# Patient Record
Sex: Male | Born: 1950 | Race: White | Hispanic: No | Marital: Married | Smoking: Light tobacco smoker
Health system: Southern US, Community
[De-identification: ages and names within clinical notes are randomized; demographics above are authoritative.]

## PROBLEM LIST (undated history)

## (undated) DIAGNOSIS — M1990 Unspecified osteoarthritis, unspecified site: Secondary | ICD-10-CM

## (undated) DIAGNOSIS — E785 Hyperlipidemia, unspecified: Secondary | ICD-10-CM

## (undated) DIAGNOSIS — N529 Male erectile dysfunction, unspecified: Secondary | ICD-10-CM

## (undated) HISTORY — DX: Male erectile dysfunction, unspecified: N52.9

## (undated) HISTORY — DX: Hyperlipidemia, unspecified: E78.5

## (undated) HISTORY — PX: DENTAL SURGERY: SHX609

## (undated) HISTORY — PX: VASECTOMY: SHX75

---

## 2003-02-11 HISTORY — PX: KNEE ARTHROSCOPY: SUR90

## 2005-09-15 ENCOUNTER — Ambulatory Visit: Payer: Self-pay | Admitting: Family Medicine

## 2005-10-08 ENCOUNTER — Ambulatory Visit: Payer: Self-pay | Admitting: Family Medicine

## 2005-10-10 ENCOUNTER — Ambulatory Visit: Payer: Self-pay | Admitting: Family Medicine

## 2005-10-14 ENCOUNTER — Ambulatory Visit: Payer: Self-pay | Admitting: Family Medicine

## 2005-10-17 ENCOUNTER — Ambulatory Visit: Payer: Self-pay | Admitting: Family Medicine

## 2005-10-22 ENCOUNTER — Ambulatory Visit: Payer: Self-pay | Admitting: Family Medicine

## 2005-12-10 ENCOUNTER — Ambulatory Visit: Payer: Self-pay | Admitting: Family Medicine

## 2006-02-24 DIAGNOSIS — F528 Other sexual dysfunction not due to a substance or known physiological condition: Secondary | ICD-10-CM | POA: Insufficient documentation

## 2006-09-18 ENCOUNTER — Ambulatory Visit: Payer: Self-pay | Admitting: Family Medicine

## 2006-09-18 ENCOUNTER — Telehealth (INDEPENDENT_AMBULATORY_CARE_PROVIDER_SITE_OTHER): Payer: Self-pay | Admitting: *Deleted

## 2008-07-25 ENCOUNTER — Ambulatory Visit: Payer: Self-pay | Admitting: Internal Medicine

## 2008-07-25 DIAGNOSIS — H0260 Xanthelasma of unspecified eye, unspecified eyelid: Secondary | ICD-10-CM | POA: Insufficient documentation

## 2008-08-16 ENCOUNTER — Ambulatory Visit: Payer: Self-pay | Admitting: Internal Medicine

## 2008-08-25 ENCOUNTER — Ambulatory Visit: Payer: Self-pay | Admitting: Internal Medicine

## 2008-08-26 LAB — CONVERTED CEMR LAB
ALT: 32 units/L (ref 0–53)
AST: 23 units/L (ref 0–37)
Albumin: 3.7 g/dL (ref 3.5–5.2)
Alkaline Phosphatase: 80 units/L (ref 39–117)
BUN: 23 mg/dL (ref 6–23)
Bilirubin, Direct: 0 mg/dL (ref 0.0–0.3)
Creatinine, Ser: 0.8 mg/dL (ref 0.4–1.5)
Hgb A1c MFr Bld: 6 % (ref 4.6–6.5)
Testosterone: 387.88 ng/dL (ref 350.00–890.00)
Total Protein: 7.3 g/dL (ref 6.0–8.3)

## 2008-08-28 ENCOUNTER — Encounter (INDEPENDENT_AMBULATORY_CARE_PROVIDER_SITE_OTHER): Payer: Self-pay | Admitting: *Deleted

## 2008-08-28 ENCOUNTER — Encounter: Payer: Self-pay | Admitting: Internal Medicine

## 2008-09-08 ENCOUNTER — Telehealth (INDEPENDENT_AMBULATORY_CARE_PROVIDER_SITE_OTHER): Payer: Self-pay | Admitting: *Deleted

## 2008-09-24 ENCOUNTER — Encounter: Payer: Self-pay | Admitting: Internal Medicine

## 2008-09-27 ENCOUNTER — Ambulatory Visit: Payer: Self-pay | Admitting: Internal Medicine

## 2008-09-27 DIAGNOSIS — E785 Hyperlipidemia, unspecified: Secondary | ICD-10-CM | POA: Insufficient documentation

## 2008-09-27 LAB — CONVERTED CEMR LAB: HDL goal, serum: 40 mg/dL

## 2008-12-20 ENCOUNTER — Ambulatory Visit: Payer: Self-pay | Admitting: Internal Medicine

## 2008-12-28 LAB — CONVERTED CEMR LAB
Bilirubin, Direct: 0.1 mg/dL (ref 0.0–0.3)
HDL: 27.7 mg/dL — ABNORMAL LOW (ref >39.00)
Hgb A1c MFr Bld: 6 % (ref 4.6–6.5)
LDL Cholesterol: 111 mg/dL — ABNORMAL HIGH (ref 0–99)
Total Bilirubin: 0.8 mg/dL (ref 0.3–1.2)
Total CHOL/HDL Ratio: 6
VLDL: 32.2 mg/dL (ref 0.0–40.0)

## 2008-12-29 ENCOUNTER — Encounter (INDEPENDENT_AMBULATORY_CARE_PROVIDER_SITE_OTHER): Payer: Self-pay | Admitting: *Deleted

## 2009-01-02 ENCOUNTER — Ambulatory Visit: Payer: Self-pay | Admitting: Internal Medicine

## 2009-06-12 ENCOUNTER — Ambulatory Visit: Payer: Self-pay | Admitting: Internal Medicine

## 2009-06-12 DIAGNOSIS — IMO0002 Reserved for concepts with insufficient information to code with codable children: Secondary | ICD-10-CM | POA: Insufficient documentation

## 2009-06-12 DIAGNOSIS — M6740 Ganglion, unspecified site: Secondary | ICD-10-CM | POA: Insufficient documentation

## 2009-06-12 DIAGNOSIS — M1710 Unilateral primary osteoarthritis, unspecified knee: Secondary | ICD-10-CM

## 2009-07-24 ENCOUNTER — Encounter (INDEPENDENT_AMBULATORY_CARE_PROVIDER_SITE_OTHER): Payer: Self-pay | Admitting: *Deleted

## 2009-07-26 ENCOUNTER — Ambulatory Visit: Payer: Self-pay | Admitting: Internal Medicine

## 2009-07-30 LAB — CONVERTED CEMR LAB
ALT: 33 units/L (ref 0–53)
Albumin: 3.9 g/dL (ref 3.5–5.2)
Bilirubin, Direct: 0.1 mg/dL (ref 0.0–0.3)
Cholesterol: 182 mg/dL (ref 0–200)
HDL: 36.1 mg/dL — ABNORMAL LOW (ref >39.00)
LDL Cholesterol: 116 mg/dL — ABNORMAL HIGH (ref 0–99)
Total Protein: 6.5 g/dL (ref 6.0–8.3)
Triglycerides: 148 mg/dL (ref 0.0–149.0)
VLDL: 29.6 mg/dL (ref 0.0–40.0)

## 2009-08-03 ENCOUNTER — Encounter: Payer: Self-pay | Admitting: Internal Medicine

## 2010-03-12 NOTE — Consult Note (Signed)
Summary: [facility]  [facility]   Imported By: Lanelle Bal 08/22/2009 09:12:52  _____________________________________________________________________  External Attachment:    Type:   Image     Comment:   External Document

## 2010-03-12 NOTE — Letter (Signed)
Summary: Primary Care Appointment Letter  [facility]  [address] [place], [place] [address]   Phone: [phone]  Fax: [phone]    07/24/2009 MRN: [id number]  James [name] [address] [place], [place]  [address]  Dear [name],   Your Primary Care Physician Marga Melnick MD has indicated that:    _______it is time to schedule an appointment.    _______you missed your appointment on______ and need to call and          reschedule.    ____X___you need to have lab work done, COPIED/PASTED FROM 12/2008 LABS: Recheck labs in 6 months (A1c, lipids; 277.7). Hopp.(Labs were due 06/2009).    _______you need to schedule an appointment discuss lab or test results.    _______you need to call to reschedule your appointment that is                       scheduled on _________.     Please call our office as soon as possible. Our phone number is [phone]. Please press option 1. Our office is open 8a-5p, Monday through Friday.     Thank you,    [facility]
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
## 2010-07-30 ENCOUNTER — Other Ambulatory Visit: Payer: Self-pay | Admitting: Internal Medicine

## 2010-07-30 NOTE — Telephone Encounter (Signed)
Last cholesterol check was 07/2009, patient can have labs 272.4/995.20 or CPX and have full lab panel

## 2010-08-17 ENCOUNTER — Other Ambulatory Visit: Payer: Self-pay | Admitting: Internal Medicine

## 2010-09-02 ENCOUNTER — Other Ambulatory Visit: Payer: Self-pay | Admitting: Internal Medicine

## 2010-09-02 NOTE — Telephone Encounter (Signed)
NMR/Hepatic 272.4/995.20

## 2010-10-05 ENCOUNTER — Other Ambulatory Visit: Payer: Self-pay | Admitting: Internal Medicine

## 2010-10-07 NOTE — Telephone Encounter (Signed)
NMR 272.4

## 2010-10-22 ENCOUNTER — Other Ambulatory Visit: Payer: Self-pay | Admitting: Internal Medicine

## 2010-10-22 DIAGNOSIS — E785 Hyperlipidemia, unspecified: Secondary | ICD-10-CM

## 2010-10-23 ENCOUNTER — Other Ambulatory Visit (INDEPENDENT_AMBULATORY_CARE_PROVIDER_SITE_OTHER): Payer: BC Managed Care – PPO

## 2010-10-23 DIAGNOSIS — E785 Hyperlipidemia, unspecified: Secondary | ICD-10-CM

## 2010-10-23 LAB — LIPID PANEL
HDL: 36.5 mg/dL — ABNORMAL LOW (ref >39.00)
LDL Cholesterol: 100 mg/dL — ABNORMAL HIGH (ref 0–99)
Total CHOL/HDL Ratio: 5
Triglycerides: 151 mg/dL — ABNORMAL HIGH (ref 0.0–149.0)

## 2010-10-23 LAB — BASIC METABOLIC PANEL
CO2: 24 mEq/L (ref 19–32)
Chloride: 108 mEq/L (ref 96–112)
Sodium: 140 mEq/L (ref 135–145)

## 2010-10-23 LAB — TSH: TSH: 2 u[IU]/mL (ref 0.35–5.50)

## 2010-10-23 LAB — HEPATIC FUNCTION PANEL: Albumin: 4 g/dL (ref 3.5–5.2)

## 2010-10-23 NOTE — Progress Notes (Signed)
Labs only

## 2010-10-25 NOTE — Progress Notes (Signed)
Labs only

## 2010-11-05 ENCOUNTER — Other Ambulatory Visit: Payer: Self-pay | Admitting: Internal Medicine

## 2010-11-26 ENCOUNTER — Emergency Department (HOSPITAL_BASED_OUTPATIENT_CLINIC_OR_DEPARTMENT_OTHER)
Admission: EM | Admit: 2010-11-26 | Discharge: 2010-11-26 | Disposition: A | Discharge status: Home / Self Care | Payer: BC Managed Care – PPO | Attending: Emergency Medicine | Admitting: Emergency Medicine

## 2010-11-26 ENCOUNTER — Encounter: Payer: Self-pay | Admitting: *Deleted

## 2010-11-26 DIAGNOSIS — S01312A Laceration without foreign body of left ear, initial encounter: Secondary | ICD-10-CM

## 2010-11-26 DIAGNOSIS — W1809XA Striking against other object with subsequent fall, initial encounter: Secondary | ICD-10-CM | POA: Insufficient documentation

## 2010-11-26 DIAGNOSIS — S01309A Unspecified open wound of unspecified ear, initial encounter: Secondary | ICD-10-CM | POA: Insufficient documentation

## 2010-11-26 DIAGNOSIS — Z8739 Personal history of other diseases of the musculoskeletal system and connective tissue: Secondary | ICD-10-CM | POA: Insufficient documentation

## 2010-11-26 HISTORY — DX: Unspecified osteoarthritis, unspecified site: M19.90

## 2010-11-26 MED ORDER — TETANUS-DIPHTH-ACELL PERTUSSIS 5-2.5-18.5 LF-MCG/0.5 IM SUSP
0.5000 mL | Freq: Once | INTRAMUSCULAR | Status: AC
  Administered 2010-11-26: 0.5 mL via INTRAMUSCULAR
  Filled 2010-11-26 (×2): qty 0.5

## 2010-11-26 MED ORDER — ONDANSETRON 8 MG PO TBDP
8.0000 mg | ORAL_TABLET | Freq: Once | ORAL | Status: AC
  Administered 2010-11-26: 8 mg via ORAL
  Filled 2010-11-26: qty 1

## 2010-11-26 MED ORDER — LIDOCAINE HCL 2 % IJ SOLN
INTRAMUSCULAR | Status: AC
  Administered 2010-11-26: 11:00:00
  Filled 2010-11-26: qty 1

## 2010-11-26 NOTE — ED Provider Notes (Signed)
History     CSN: [id number] Arrival date & time: 11/26/2010 10:28 AM  Chief Complaint  Patient presents with  . Ear Laceration    (Consider location/radiation/quality/duration/timing/severity/associated sxs/prior treatment) HPI Comments: Pt reports he missed a step and lost his balance, falling against the window of his car.  Reports laceration to left ear.  Denies LOC, dizziness or confusion after impact, difficulty with gait, vomiting, focal neurological deficits.  Pt is not on any blood thinners.   Patient is a 60 y.o. male presenting with skin laceration. The history is provided by the patient. No language interpreter was used.  Laceration  Pain location: left ear. Injury mechanism: trip and fall against car window.    Past Medical History  Diagnosis Date  . Arthritis     Past Surgical History  Procedure Date  . Dental surgery     full dental replacement  . Knee arthroscopy 2005  . Vasectomy     No family history on file.  History  Substance Use Topics  . Smoking status: Former Games developer  . Smokeless tobacco: Not on file  . Alcohol Use: No      Review of Systems  All other systems reviewed and are negative.    Allergies  Sulfur  Home Medications   Current Outpatient Rx  Name Route Sig Dispense Refill  . HYDROCODONE-ACETAMINOPHEN 5-325 MG PO TABS Oral Take 1 tablet by mouth every 6 (six) hours as needed.      Marland Kitchen CIALIS 20 MG PO TABS  TAKE 1 TABLET BY MOUTH EVERY 3 DAYS AS NEEDED 6 tablet 0    **APPOINTMENT DUE**  . CRESTOR 20 MG PO TABS  TAKE ONE TABLET BY MOUTH DAILY 30 tablet 0    **APPOINTMENT DUE**    BP 154/81  Pulse 96  Temp(Src) 98 F (36.7 C) (Oral)  Resp 20  Ht 5\' 8"  (1.727 m)  Wt 252 lb (114.306 kg)  BMI 38.32 kg/m2  SpO2 96%  Physical Exam  Constitutional: He is oriented to person, place, and time. He appears well-developed and well-nourished.  HENT:  Head: Normocephalic. Head is without raccoon's eyes and without Battle's sign.    Left Ear: Left ear exhibits lacerations.       Lacerations to left pinna, anterior and posterior.    Neck: Neck supple.  Pulmonary/Chest: Effort normal.  Neurological: He is alert and oriented to person, place, and time.    ED Course  LACERATION REPAIR Date/Time: 11/26/2010 11:35 AM Performed by: Trixie Dredge B Authorized by: Trixie Dredge B Consent: Verbal consent obtained. Written consent not obtained. Consent given by: patient Patient understanding: patient states understanding of the procedure being performed Patient identity confirmed: verbally with patient Body area: head/neck Location details: left ear Laceration length: 4 cm Foreign bodies: no foreign bodies Tendon involvement: none Anesthesia: nerve block and local infiltration Local anesthetic: lidocaine 2% without epinephrine Patient sedated: no Preparation: Patient was prepped and draped in the usual sterile fashion. Irrigation solution: saline Irrigation method: syringe Amount of cleaning: standard Debridement: none Degree of undermining: none Skin closure: 5-0 nylon Number of sutures: 11 Technique: simple Approximation: close Approximation difficulty: simple Dressing: gauze roll Patient tolerance: Patient tolerated the procedure well with no immediate complications. Comments: Tetanus vaccine given.    (including critical care time)  Labs Reviewed - No data to display No results found.   1. Laceration of left ear, external       MDM  Patient with trip and fall against window, no e/o  concussion or major head injury. Laceration of left ear.  Discussed close follow up and reasons for immediate return.  Pt verbalizes understanding.         Rise Patience, [place] 11/26/10 1235

## 2010-11-26 NOTE — ED Notes (Signed)
Patient states he missed a step in his gargage and fell against the car window.  Laceration to his left ear lobe.  Bleeding controlled. No loc.

## 2010-11-26 NOTE — ED Provider Notes (Signed)
History/physical exam/procedure(s) were performed by non-physician practitioner and as supervising physician I was immediately available for consultation/collaboration. I have reviewed all notes and am in agreement with care and plan.   Hilario Quarry, MD 11/26/10 [phone]

## 2010-11-28 ENCOUNTER — Emergency Department (HOSPITAL_BASED_OUTPATIENT_CLINIC_OR_DEPARTMENT_OTHER)
Admission: EM | Admit: 2010-11-28 | Discharge: 2010-11-28 | Disposition: A | Discharge status: Home / Self Care | Payer: BC Managed Care – PPO | Attending: Emergency Medicine | Admitting: Emergency Medicine

## 2010-11-28 ENCOUNTER — Encounter (HOSPITAL_BASED_OUTPATIENT_CLINIC_OR_DEPARTMENT_OTHER): Payer: Self-pay | Admitting: Family Medicine

## 2010-11-28 DIAGNOSIS — Z5189 Encounter for other specified aftercare: Secondary | ICD-10-CM

## 2010-11-28 DIAGNOSIS — Z8739 Personal history of other diseases of the musculoskeletal system and connective tissue: Secondary | ICD-10-CM | POA: Insufficient documentation

## 2010-11-28 DIAGNOSIS — Z09 Encounter for follow-up examination after completed treatment for conditions other than malignant neoplasm: Secondary | ICD-10-CM | POA: Insufficient documentation

## 2010-11-28 NOTE — ED Provider Notes (Signed)
History     CSN: [id number] Arrival date & time: 11/28/2010  4:01 PM   First MD Initiated Contact with Patient 11/28/10 1554      Chief Complaint  Patient presents with  . Follow-up    (Consider location/radiation/quality/duration/timing/severity/associated sxs/prior treatment) HPI Pt states he is here to have his ear checked.  He had a complex ear laceration two days ago and was told to get the wound checked.  The laceration is on the left ear.  He has had no pain or drainage.  The severity is mild.  No fevers or other complaints.  Pt feels that it is healing well. Past Medical History  Diagnosis Date  . Arthritis     Past Surgical History  Procedure Date  . Dental surgery     full dental replacement  . Knee arthroscopy 2005  . Vasectomy     No family history on file.  History  Substance Use Topics  . Smoking status: Former Games developer  . Smokeless tobacco: Not on file  . Alcohol Use: No      Review of Systems  All other systems reviewed and are negative.    Allergies  Sulfur  Home Medications   Current Outpatient Rx  Name Route Sig Dispense Refill  . CIALIS 20 MG PO TABS  TAKE 1 TABLET BY MOUTH EVERY 3 DAYS AS NEEDED 6 tablet 0    **APPOINTMENT DUE**  . CRESTOR 20 MG PO TABS  TAKE ONE TABLET BY MOUTH DAILY 30 tablet 0    **APPOINTMENT DUE**  . HYDROCODONE-ACETAMINOPHEN 5-325 MG PO TABS Oral Take 1 tablet by mouth every 6 (six) hours as needed.        BP 144/77  Pulse 67  Temp(Src) 98 F (36.7 C) (Oral)  Resp 16  SpO2 97%  Physical Exam  Nursing note and vitals reviewed. Constitutional: He appears well-developed and well-nourished. No distress.  HENT:  Head: Normocephalic and atraumatic.  Right Ear: External ear normal.       Left external ear, small amount of edema and ecchymoses, sutures without e/e, no drainage, no significant auricular hematoma  Eyes: Conjunctivae are normal. Right eye exhibits no discharge. Left eye exhibits no discharge. No  scleral icterus.  Neck: Neck supple. No tracheal deviation present.  Pulmonary/Chest: Effort normal. No stridor. No respiratory distress.  Musculoskeletal: He exhibits no edema.  Neurological: He is alert. Cranial nerve deficit: no gross deficits.  Skin: Skin is warm and dry. No rash noted.  Psychiatric: He has a normal mood and affect.    ED Course  Procedures (including critical care time)  Labs Reviewed - No data to display No results found.   No diagnosis found.    MDM  Wound appears to be healing well.  Ok for suture removal as scheduled        Celene Kras, MD 11/28/10 1610

## 2010-11-28 NOTE — ED Notes (Signed)
Pt here for recheck of sutures to left ear. No signs of infection.

## 2010-12-02 ENCOUNTER — Encounter: Payer: Self-pay | Admitting: Internal Medicine

## 2010-12-03 ENCOUNTER — Ambulatory Visit (INDEPENDENT_AMBULATORY_CARE_PROVIDER_SITE_OTHER): Payer: BC Managed Care – PPO | Admitting: Internal Medicine

## 2010-12-03 ENCOUNTER — Encounter: Payer: Self-pay | Admitting: Internal Medicine

## 2010-12-03 DIAGNOSIS — X58XXXA Exposure to other specified factors, initial encounter: Secondary | ICD-10-CM

## 2010-12-03 DIAGNOSIS — T148XXA Other injury of unspecified body region, initial encounter: Secondary | ICD-10-CM

## 2010-12-03 DIAGNOSIS — S86919A Strain of unspecified muscle(s) and tendon(s) at lower leg level, unspecified leg, initial encounter: Secondary | ICD-10-CM

## 2010-12-03 DIAGNOSIS — W1809XA Striking against other object with subsequent fall, initial encounter: Secondary | ICD-10-CM

## 2010-12-03 DIAGNOSIS — IMO0002 Reserved for concepts with insufficient information to code with codable children: Secondary | ICD-10-CM

## 2010-12-03 DIAGNOSIS — S86819A Strain of other muscle(s) and tendon(s) at lower leg level, unspecified leg, initial encounter: Secondary | ICD-10-CM

## 2010-12-03 DIAGNOSIS — W1800XA Striking against unspecified object with subsequent fall, initial encounter: Secondary | ICD-10-CM

## 2010-12-03 NOTE — Patient Instructions (Signed)
Use an anti-inflammatory cream such as Aspercreme or Zostrix cream twice a day to the left knee as needed. In lieu of this warm moist compresses or  hot water bottle can be used. Do not apply ice to the knees. .If symptoms persist or progress despite present treatment , Orthopedic F/U indicated. Eat a low-fat diet with lots of fruits and vegetables, up to 7-9 servings per day. Avoid obesity; your goal is waist measurement < 40 inches.Consume less than 40 grams of sugar per day from foods & drinks with High Fructose Corn Sugar as #1,2,3 or # 4 on label. Follow the low carb nutrition program in [employer] as closely as possible to prevent Diabetes progression & complications. White carbohydrates (potatoes, rice, bread, and pasta) have a high spike of sugar and a high load of sugar. For example a  baked potato has a cup of sugar and a  french fry  2 teaspoons of sugar. Yams, wild  rice, whole grained bread &  wheat pasta have been much lower spike and load of  sugar. Portions should be the size of a deck of cards or your palm.

## 2010-12-03 NOTE — Progress Notes (Signed)
  Subjective:    Patient ID: James [name], male    DOB: December 03, 1950, 60 y.o.   MRN: [id number]  HPI Fall  :   Onset: 10/16   .  Context: misstepped walking down brick steps & struck L ear on wife's car.  Eleven stitches  LOC/ Duration : not with accident or since  .   Cardiac Prodrome: heart racing/ heart irregularity/ palpitations : no  .   Neuro Prodrome:headache; numbness & tingling  ; weakness ;  Vertigo;gait dysfunction/falling ; tremor:no   ROS:  body injury:? Injury to L knee  Extremity pain: Pain quality:dull Pain severity:up to 5 Duration:with each step Radiation:no Treatment/response:ice, Celebrex, Tramadol, Voltaren gel . Celebrex has been more effective than the other interventions Review of systems: Constitutional: no fever, chills, sweats  Musculoskeletal:no  muscle cramps or pain; no  joint redness, or swelling. Localized stiffness medially Skin:no rash, color change Neuro: no weakness; numbness and tingling Heme:no  abnormal bruising or bleeding   Past medical history: His orthopedist has injected the right knee with steroids with good response                                                                                                                                                       Review of Systems     Objective:   Physical Exam he is in no acute distress.  The sutures were removed by the nurse without difficulty. There were 11. The laceration is well healed with no sign of cellulitis.  Cranial nerve exam is normal. He does have bilateral mild ptosis. Ears increased cerumen in the canals.  He has no lymphadenopathy about the neck  He has fusiform changes of both knees; minimal knee effusion is suggested, possibly actually worse on the right,. There is crepitus on the left but good range of motion.          Assessment & Plan:  #1 fall due  to misstep; no cardiac or neuro disposition  #2 laceration well healed with no sign of  complication  #3 strain the knee in the fall with minimal effusion  Plan: See orders and recommendations

## 2010-12-07 ENCOUNTER — Other Ambulatory Visit: Payer: Self-pay | Admitting: Internal Medicine

## 2011-07-21 ENCOUNTER — Other Ambulatory Visit: Payer: Self-pay | Admitting: Internal Medicine

## 2011-07-21 NOTE — Telephone Encounter (Signed)
Last Ov 12-03-10, last filled 7--7-12 #6

## 2011-07-21 NOTE — Telephone Encounter (Signed)
OK X1 

## 2011-10-27 ENCOUNTER — Other Ambulatory Visit: Payer: Self-pay | Admitting: Internal Medicine

## 2011-10-27 DIAGNOSIS — T887XXA Unspecified adverse effect of drug or medicament, initial encounter: Secondary | ICD-10-CM

## 2011-10-27 DIAGNOSIS — E785 Hyperlipidemia, unspecified: Secondary | ICD-10-CM

## 2011-10-27 MED ORDER — ROSUVASTATIN CALCIUM 20 MG PO TABS: ORAL_TABLET | ORAL | Status: DC

## 2011-10-27 NOTE — Telephone Encounter (Signed)
Refill crestor 20mg  tablets #30 last refill 9.4.13 no instructions listed Last ov 10.23.12 f/u to remove stitches last labs 10/2010

## 2011-10-27 NOTE — Telephone Encounter (Signed)
Patient needs to have labs (future orders placed), patient also needs to schedule a CPX

## 2011-12-14 ENCOUNTER — Other Ambulatory Visit: Payer: Self-pay | Admitting: Internal Medicine

## 2011-12-16 NOTE — Telephone Encounter (Signed)
Order placed

## 2012-01-12 ENCOUNTER — Other Ambulatory Visit: Payer: Self-pay | Admitting: Internal Medicine

## 2012-01-12 MED ORDER — ROSUVASTATIN CALCIUM 20 MG PO TABS: ORAL_TABLET | ORAL | Status: DC

## 2012-01-12 NOTE — Telephone Encounter (Signed)
refill Crestor 20MG  tablets #30 take one tablet b y mouth Daily #30 last fill 11.02.13-NOTE NO FUTURE Appts/LABS scheduled as per last refill

## 2012-01-12 NOTE — Telephone Encounter (Signed)
1/2 Supply given due to numerous reminders that appointment due, future orders placed since 10/2011

## 2012-02-13 ENCOUNTER — Other Ambulatory Visit: Payer: Self-pay | Admitting: Internal Medicine

## 2012-02-13 NOTE — Telephone Encounter (Signed)
Future orders placed 

## 2012-02-23 ENCOUNTER — Other Ambulatory Visit (INDEPENDENT_AMBULATORY_CARE_PROVIDER_SITE_OTHER): Payer: BC Managed Care – PPO

## 2012-02-23 DIAGNOSIS — T887XXA Unspecified adverse effect of drug or medicament, initial encounter: Secondary | ICD-10-CM

## 2012-02-23 DIAGNOSIS — E785 Hyperlipidemia, unspecified: Secondary | ICD-10-CM

## 2012-02-23 LAB — LIPID PANEL
Cholesterol: 158 mg/dL (ref 0–200)
HDL: 29.7 mg/dL — ABNORMAL LOW (ref >39.00)
Triglycerides: 126 mg/dL (ref 0.0–149.0)

## 2012-02-23 LAB — HEPATIC FUNCTION PANEL
ALT: 37 U/L (ref 0–53)
AST: 23 U/L (ref 0–37)
Albumin: 3.8 g/dL (ref 3.5–5.2)
Alkaline Phosphatase: 71 U/L (ref 39–117)
Total Protein: 7.4 g/dL (ref 6.0–8.3)

## 2012-02-24 ENCOUNTER — Encounter: Payer: Self-pay | Admitting: Internal Medicine

## 2012-02-28 ENCOUNTER — Other Ambulatory Visit: Payer: Self-pay | Admitting: Internal Medicine

## 2012-03-03 ENCOUNTER — Telehealth: Payer: Self-pay | Admitting: Internal Medicine

## 2012-03-03 NOTE — Telephone Encounter (Signed)
pt called in scheduled OV for Friday 1.24.13 at 3:30pm

## 2012-03-03 NOTE — Telephone Encounter (Signed)
Message copied by Verner Chol on Wed Mar 03, 2012 10:44 AM ------      Message from: Arvilla Meres P      Created: Thu Feb 26, 2012 11:50 AM      Regarding: FYI       Pt needs OV-lm w spouse for pt to call office and schedule appt.      ----- Message -----         From: Pecola Lawless, MD         Sent: 02/24/2012   6:03 PM           To: Marshell Garfinkel             Please make appointment to discuss long term risk & options.Last OV 10/12

## 2012-03-05 ENCOUNTER — Ambulatory Visit (INDEPENDENT_AMBULATORY_CARE_PROVIDER_SITE_OTHER): Payer: BC Managed Care – PPO | Admitting: Internal Medicine

## 2012-03-05 ENCOUNTER — Encounter: Payer: Self-pay | Admitting: Internal Medicine

## 2012-03-05 VITALS — BP 124/82 | HR 67 | Wt 268.0 lb

## 2012-03-05 DIAGNOSIS — E785 Hyperlipidemia, unspecified: Secondary | ICD-10-CM

## 2012-03-05 NOTE — Progress Notes (Signed)
  Subjective:    Patient ID: James [name], male    DOB: December 26, 1950, 62 y.o.   MRN: [id number]  HPI Dyslipidemia assessment: Prior Advanced Lipid Testing: NMR 2010; LDL goal < 100, ideally < 70.   Family history of premature CAD/ MI: no but stents in bro & mother .  Nutrition: increased salad & fish but eating out  .  Exercise: to start . Diabetes : no . HTN: no. Smoking history  :1-2  cigars/week    Lab results reviewed : in 2010 his LDL was 223; this was done closed over 3100 particles with 1640 small dense particles were 31% long-term risk. Triglycerides at that time were 210 suggesting suboptimal nutrition.  There's been a phenomenal improvement in his lipids. His triglycerides are now 126 and his LDL is 103 on 20 mg of Crestor daily.    Review of Systems Weight : stable. Emotional fatigue (wife has Parkinson's) ; no chest pain  ; no claudication; no palpitations; no abd pain/bowel changes ;no myalgias; no syncope  ;no  memory loss; no skin changes     Objective:   Physical Exam He appears well-nourished; he is in no acute distress  No carotid bruits are present.  Heart rhythm and rate are normal with no significant murmurs or gallops.S4 with slurring at  LSB   Chest is clear with no increased work of breathing  There is no evidence of aortic aneurysm or renal artery bruits  He has no clubbing or edema.   Pedal pulses are intact   No ischemic skin changes are present         Assessment & Plan:

## 2012-03-05 NOTE — Assessment & Plan Note (Signed)
No changes recommended medication; we discussed the importance of regular cardiovascular exercise and good nutrition.

## 2012-03-05 NOTE — Patient Instructions (Addendum)
The most common cause of elevated triglycerides is the ingestion of sugar from high fructose corn syrup sources added to processed foods & drinks.  Eat a low-fat diet with lots of fruits and vegetables, up to 7-9 servings per day. Consume less than 40 (preferably ZERO) grams of sugar per day from foods & drinks with High Fructose Corn Syrup (HFCS) sugar as #1,2,3 or # 4 on label.Whole Foods, [employer] & [employer] do not carry products with HFCS. Cardiovascular exercise, this can be as simple a program as walking, is recommended 30-45 minutes 3-4 times per week. If you're not exercising you should take 6-8 weeks to build up to this level.  Please take enteric-coated aspirin 81 mg daily with breakfast.   If you activate My Chart; the results can be released to you as soon as they populate from the lab. If you choose not to use this program; the labs have to be reviewed, copied & mailed   causing a delay in getting the results to you.

## 2012-04-02 ENCOUNTER — Other Ambulatory Visit: Payer: Self-pay | Admitting: Internal Medicine

## 2012-07-06 ENCOUNTER — Telehealth: Payer: Self-pay | Admitting: *Deleted

## 2012-07-06 DIAGNOSIS — E785 Hyperlipidemia, unspecified: Secondary | ICD-10-CM

## 2012-07-06 DIAGNOSIS — T887XXA Unspecified adverse effect of drug or medicament, initial encounter: Secondary | ICD-10-CM

## 2012-07-06 NOTE — Telephone Encounter (Signed)
Based on his NMR LipoProfile he had approximately 31% increased risk of heart attack or stroke based on particle number and LDL of 223. We can see if  atorvastatin 20 mg daily will get him to goal. If not then we can request the Crestor.  Hold Crestor and start atorvastatin 20 mg daily. Dispense 90.  Fasting lipids, hepatic panel, CK after 10 weeks of Lipitor. Codes 272.4, 995.20

## 2012-07-06 NOTE — Telephone Encounter (Signed)
Fax from pharmacy indicating that prior auth needed. Review Pt chart no documentation of Pt trying any other cholesterol med beside crestor. Please advise if you would like to proceed with PA

## 2012-07-07 MED ORDER — ATORVASTATIN CALCIUM 20 MG PO TABS: 20.0000 mg | ORAL_TABLET | Freq: Every day | ORAL | Status: DC

## 2012-07-07 NOTE — Telephone Encounter (Signed)
Discuss with patient, Rx sent. 

## 2012-07-09 ENCOUNTER — Other Ambulatory Visit: Payer: Self-pay | Admitting: Internal Medicine

## 2012-10-04 ENCOUNTER — Other Ambulatory Visit: Payer: Self-pay | Admitting: Internal Medicine

## 2012-10-13 ENCOUNTER — Ambulatory Visit (INDEPENDENT_AMBULATORY_CARE_PROVIDER_SITE_OTHER): Payer: BC Managed Care – PPO | Admitting: Family Medicine

## 2012-10-13 ENCOUNTER — Encounter: Payer: Self-pay | Admitting: Family Medicine

## 2012-10-13 VITALS — BP 128/80 | HR 67 | Temp 98.3°F | Ht 68.0 in | Wt 267.6 lb

## 2012-10-13 DIAGNOSIS — E785 Hyperlipidemia, unspecified: Secondary | ICD-10-CM

## 2012-10-13 DIAGNOSIS — L909 Atrophic disorder of skin, unspecified: Secondary | ICD-10-CM

## 2012-10-13 DIAGNOSIS — L918 Other hypertrophic disorders of the skin: Secondary | ICD-10-CM

## 2012-10-13 DIAGNOSIS — T887XXA Unspecified adverse effect of drug or medicament, initial encounter: Secondary | ICD-10-CM

## 2012-10-13 LAB — HEPATIC FUNCTION PANEL
Alkaline Phosphatase: 68 U/L (ref 39–117)
Bilirubin, Direct: 0 mg/dL (ref 0.0–0.3)
Total Bilirubin: 0.3 mg/dL (ref 0.3–1.2)

## 2012-10-13 LAB — LIPID PANEL
Total CHOL/HDL Ratio: 7
VLDL: 54.6 mg/dL — ABNORMAL HIGH (ref 0.0–40.0)

## 2012-10-13 NOTE — Patient Instructions (Addendum)
We'll call you with your dermatology appt Call with any questions or concerns Happy early birthday!!!

## 2012-10-13 NOTE — Assessment & Plan Note (Signed)
New.  Given close proximity to eye and the fact that it is now in pt's field of vision, will refer to derm for removal.  Pt expressed understanding and is in agreement w/ plan.

## 2012-10-13 NOTE — Progress Notes (Signed)
  Subjective:    Patient ID: James [name], male    DOB: 10-May-1950, 62 y.o.   MRN: [id number]  HPI Skin tags- large skin tag present at lash line of R lower lid.  Has smaller scattered tags on L eye and R upper and lower lids.  Large tag is now interfering w/ field of vision.  Tender when rubbed over.  Desires removal.   Review of Systems For ROS see HPI     Objective:   Physical Exam  Vitals reviewed. Constitutional: He appears well-developed and well-nourished. No distress.  HENT:  Head: Normocephalic and atraumatic.  Eyes: Conjunctivae and EOM are normal. Pupils are equal, round, and reactive to light.  Large skin tag under R eye along lower lid w/ multiple scattered skin tags along lids bilaterally          Assessment & Plan:

## 2012-10-14 ENCOUNTER — Encounter: Payer: Self-pay | Admitting: *Deleted

## 2012-12-27 ENCOUNTER — Other Ambulatory Visit: Payer: Self-pay | Admitting: Internal Medicine

## 2012-12-27 NOTE — Telephone Encounter (Signed)
cialis refilled per protocol

## 2013-01-01 ENCOUNTER — Other Ambulatory Visit: Payer: Self-pay | Admitting: Internal Medicine

## 2013-01-02 NOTE — Telephone Encounter (Signed)
Atorvastatin refilled per protocol 

## 2013-01-30 ENCOUNTER — Other Ambulatory Visit: Payer: Self-pay | Admitting: Internal Medicine

## 2013-02-01 NOTE — Telephone Encounter (Signed)
Atorvastatin refilled per protocol. OV due. JG//CMA

## 2013-02-28 ENCOUNTER — Other Ambulatory Visit: Payer: Self-pay | Admitting: Internal Medicine

## 2013-03-29 ENCOUNTER — Other Ambulatory Visit: Payer: Self-pay | Admitting: Internal Medicine

## 2013-03-30 NOTE — Telephone Encounter (Signed)
Rx sent to the pharmacy by e-script.  Pt needs complete physical.//AB/CMA 

## 2013-04-28 ENCOUNTER — Other Ambulatory Visit: Payer: Self-pay | Admitting: Internal Medicine

## 2013-05-19 ENCOUNTER — Telehealth: Payer: Self-pay | Admitting: *Deleted

## 2013-05-19 NOTE — Telephone Encounter (Signed)
Message copied by Verdie ShireBAYNES, ANGELA M on Thu May 19, 2013  2:43 PM ------      Message from: Pecola LawlessHOPPER, WILLIAM F      Created: Wed May 18, 2013  5:13 PM       Check formulary coverage for Cialis; alternatively cheaper therapeutically equivalent option for Cialis or Viagra is available from  [facility] at [phone]. ------

## 2013-05-19 NOTE — Telephone Encounter (Signed)
LMOM (2:43pm) asking the pt to RTC regarding Prior Authorization for the Cialis 20mg .//AB/CMA

## 2013-05-19 NOTE — Telephone Encounter (Signed)
Spoke with the pt and informed him of Dr. Frederik PearHopper's note below.  Pt understood and stated that he will call [facility].  Pt was given the number to call.//AB/CMA

## 2013-05-27 ENCOUNTER — Other Ambulatory Visit: Payer: Self-pay | Admitting: Internal Medicine

## 2013-05-27 ENCOUNTER — Telehealth: Payer: Self-pay | Admitting: Internal Medicine

## 2013-05-27 DIAGNOSIS — E785 Hyperlipidemia, unspecified: Secondary | ICD-10-CM

## 2013-05-27 DIAGNOSIS — R7309 Other abnormal glucose: Secondary | ICD-10-CM

## 2013-05-27 NOTE — Telephone Encounter (Signed)
I am sorry , but he needs F/U of lipids & OV  before I can do this. I saw him last 03/05/12. Orders entered

## 2013-05-27 NOTE — Telephone Encounter (Signed)
I called and spoke to patient. He states the [place] office is to far for him so [facility] and get things handled there.

## 2013-05-27 NOTE — Telephone Encounter (Signed)
Patient asks that an rx for Sildenafil (Viagra) be sent to [facility] [place]. Please follow-up.

## 2013-06-22 ENCOUNTER — Ambulatory Visit (INDEPENDENT_AMBULATORY_CARE_PROVIDER_SITE_OTHER): Payer: BC Managed Care – PPO | Admitting: Internal Medicine

## 2013-06-22 ENCOUNTER — Encounter: Payer: Self-pay | Admitting: Internal Medicine

## 2013-06-22 VITALS — BP 136/80 | HR 77 | Temp 98.5°F | Wt 269.8 lb

## 2013-06-22 DIAGNOSIS — M546 Pain in thoracic spine: Secondary | ICD-10-CM

## 2013-06-22 MED ORDER — TRAMADOL HCL 50 MG PO TABS: 50.0000 mg | ORAL_TABLET | Freq: Three times a day (TID) | ORAL | Status: DC | PRN

## 2013-06-22 MED ORDER — SILDENAFIL CITRATE 20 MG PO TABS: ORAL_TABLET | ORAL | Status: DC

## 2013-06-22 MED ORDER — CYCLOBENZAPRINE HCL 5 MG PO TABS: ORAL_TABLET | ORAL | Status: DC
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
## 2013-06-22 NOTE — Patient Instructions (Signed)
Use an anti-inflammatory cream such as Aspercreme or Zostrix cream twice a day to the affected area as needed. In lieu of this warm moist compresses or  hot water bottle can be used. Do not apply ice . 

## 2013-06-22 NOTE — Progress Notes (Signed)
Pre visit review using our clinic review tool, if applicable. No additional management support is needed unless otherwise documented below in the visit note. 

## 2013-06-23 DIAGNOSIS — E669 Obesity, unspecified: Secondary | ICD-10-CM | POA: Insufficient documentation

## 2013-06-24 ENCOUNTER — Other Ambulatory Visit: Payer: Self-pay | Admitting: Internal Medicine

## 2013-09-30 ENCOUNTER — Other Ambulatory Visit: Payer: BC Managed Care – PPO

## 2013-09-30 ENCOUNTER — Ambulatory Visit (INDEPENDENT_AMBULATORY_CARE_PROVIDER_SITE_OTHER): Payer: BC Managed Care – PPO | Admitting: Internal Medicine

## 2013-09-30 ENCOUNTER — Encounter: Payer: Self-pay | Admitting: Internal Medicine

## 2013-09-30 VITALS — BP 144/70 | HR 71 | Temp 98.2°F | Wt 266.2 lb

## 2013-09-30 DIAGNOSIS — M79661 Pain in right lower leg: Secondary | ICD-10-CM

## 2013-09-30 DIAGNOSIS — M1710 Unilateral primary osteoarthritis, unspecified knee: Secondary | ICD-10-CM

## 2013-09-30 DIAGNOSIS — M1711 Unilateral primary osteoarthritis, right knee: Secondary | ICD-10-CM

## 2013-09-30 DIAGNOSIS — M79609 Pain in unspecified limb: Secondary | ICD-10-CM

## 2013-09-30 MED ORDER — TRAMADOL HCL 50 MG PO TABS: 50.0000 mg | ORAL_TABLET | Freq: Three times a day (TID) | ORAL | Status: DC | PRN

## 2013-09-30 NOTE — Progress Notes (Signed)
   Subjective:    Patient ID: James [name], male    DOB: 08/19/1950, 63 y.o.   MRN: [id number]  HPI   His symptoms began 09/10/13 h as pain in the right inferior thigh/medial popliteal area after playing golf. He questioned injury to the hamstring tendon.  The pain is described as dull and intermittent, up to a level X at times.  Sitting on the bucket seat of his car causes weakness in the leg which takes a few steps to resolve when he gets out of the car.There is no definite sciatica .  He has been using ice with minimal response  He describes joint stiffness and posterior myalgia pain.  There is some numbness along the right lateral knee intermittently.  He denies any cardiopulmonary symptoms although he is concerned about the possibly of deep venous thrombosis.    Review of Systems  He specifically denies redness, swelling, or change in the temperature or color of the skin. There is no associated rash, fever, chills, sweats, weight loss.  The numbness is not associated with tingling or LBP with radicular symptoms.  He has no incontinence of urine or stool    Chest pain, palpitations, tachycardia, exertional dyspnea, paroxysmal nocturnal dyspnea, claudication or edema are absent.       Objective:   Physical Exam  Pertinent positive findings include fusiform changes of the knees, especially the right with associated crepitus. Effusion is questionable. There is some discomfort with range of motion of the knee but not at the right hip. There is slight tenderness to palpation over the tendon structures over the right popliteal area. There is no definite popliteal cyst seen. Homans sign is negative. 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  As per CDC Guidelines ,Epic documents severe obesity as being present .  General appearance :adequately nourished; in no distress. Eyes: No conjunctival inflammation or scleral icterus is present. Heart:  Normal rate and  regular rhythm. S1 and S2 normal without gallop, murmur, click, rub or other extra sounds   Lungs:Chest clear to auscultation; no wheezes, rhonchi,rales ,or rubs present.No increased work of breathing.  Abdomen: Protuberant;bowel sounds normal Abdomen soft and non-tender without masses, organomegaly or hernias noted.  No guarding or rebound. No flank tenderness to percussion. Skin:Warm & dry.  Intact without suspicious lesions or rashes ; no jaundice or tenting Lymphatic: No lymphadenopathy is noted about the head, neck, axilla            Assessment & Plan:  #1 R leg pain of possible tendonous etiology but DJD present. Clinically DVT not present See orders & AVS

## 2013-09-30 NOTE — Progress Notes (Signed)
Pre visit review using our clinic review tool, if applicable. No additional management support is needed unless otherwise documented below in the visit note. 

## 2013-09-30 NOTE — Patient Instructions (Addendum)
Use an anti-inflammatory cream such as Aspercreme or Zostrix cream twice a day to the affected area as needed. In lieu of this warm moist compresses or  hot water bottle can be used. Do not apply ice .I recommend you see Dr Terrilee FilesZach Smith, Sports Medicine specialist., phone # [phone] if no better with Tramadol as needed. Get adapter for bucket seat to keep pressure off nerve.

## 2013-10-01 LAB — D-DIMER, QUANTITATIVE: D-Dimer, Quant: 0.29 ug/mL-FEU (ref 0.00–0.48)

## 2013-10-07 ENCOUNTER — Encounter: Payer: Self-pay | Admitting: Family Medicine

## 2013-10-07 ENCOUNTER — Other Ambulatory Visit (INDEPENDENT_AMBULATORY_CARE_PROVIDER_SITE_OTHER): Payer: BC Managed Care – PPO

## 2013-10-07 ENCOUNTER — Ambulatory Visit (INDEPENDENT_AMBULATORY_CARE_PROVIDER_SITE_OTHER): Payer: BC Managed Care – PPO | Admitting: Family Medicine

## 2013-10-07 VITALS — BP 126/82 | HR 79 | Ht 68.0 in | Wt 264.0 lb

## 2013-10-07 DIAGNOSIS — M25561 Pain in right knee: Secondary | ICD-10-CM

## 2013-10-07 DIAGNOSIS — IMO0002 Reserved for concepts with insufficient information to code with codable children: Secondary | ICD-10-CM

## 2013-10-07 DIAGNOSIS — M1711 Unilateral primary osteoarthritis, right knee: Secondary | ICD-10-CM

## 2013-10-07 DIAGNOSIS — M1710 Unilateral primary osteoarthritis, unspecified knee: Secondary | ICD-10-CM

## 2013-10-07 DIAGNOSIS — S76311A Strain of muscle, fascia and tendon of the posterior muscle group at thigh level, right thigh, initial encounter: Secondary | ICD-10-CM | POA: Insufficient documentation

## 2013-10-07 DIAGNOSIS — M25569 Pain in unspecified knee: Secondary | ICD-10-CM

## 2013-10-07 NOTE — Patient Instructions (Addendum)
Good to meet you Ice 20 minutes 2 times daily. Usually after activity and before bed. Exercises 3 times a week.  Wear brace with a lot of activity Take tylenol 650 mg three times a day is the best evidence based medicine we have for arthritis. This you can take with your tramadol.  Glucosamine sulfate  twice a day is a supplement that has been shown to help moderate to severe arthritis. Vitamin D 2000 IU daily Fish oil 2 grams daily.  Tumeric  twice daily.  Capsaicin topically up to four times a day may also help with pain. Cortisone injections are an option if these interventions do not seem to make a difference or need more relief.  If cortisone injections do not help, there are different types of shots that may help but they take longer to take effect.  We can discuss this at follow up.  It's important that you continue to stay active. Controlling your weight is important.  Consider physical therapy to strengthen muscles around the joint that hurts to take pressure off of the joint itself. Shoe inserts with good arch support may be helpful.  [employer] [facility] [employer] [facility] could help.  Water aerobics and cycling with low resistance are the best two types of exercise for arthritis. Come back and see me in 3 weeks.

## 2013-10-07 NOTE — Assessment & Plan Note (Signed)
Patient does have severe osteophytic changes of the right knee. Patient has what appears to be bone on bone of the medial joint line. Patient has not had an injection for greater than one year and we will determine today. Patient did have significant results. Patient was also given a brace was fitted by me today. We discussed icing as well as range of motion exercises. She will try this and we discussed the over-the-counter medication to could be beneficial. Patient will try all of these modalities then come back again in 3 weeks for further evaluation and treatment.

## 2013-10-07 NOTE — Progress Notes (Signed)
[name] [address] [place], [place] [address] Phone: [phone] Subjective:    I'm seeing this patient by the request  of:  Marga Melnick, MD   CC: Right knee pain  [id number] James [name] is a 63 y.o. male coming in with complaint of right knee pain. Patient has known osteoarthritic changes of this  for multiple years.  Patient states he is seeing another provider in the discussed that he would need knee replacement in the near future. Patient was given injections but the last steroid injection patient did have a greater than one year ago. Patient had been doing relatively well until the last month where he started having increasing pain especially over the medial aspect of the knee. Patient states that when he was playing golf he had difficulty getting out of a bunker secondary to the pain and did have to stop playing. Patient states she's been having a dull aching sensation at night and can keep him up at night as well. States that he is also noticed some mild loss of range of motion. Denies any radiation down the leg or any numbness but does notice that his hip has been hurting more as well. Severity of 8/10. Not responding to over-the-counter medications as well.     Past medical history, social, surgical and family history all reviewed in electronic medical record.   Review of Systems: No headache, visual changes, nausea, vomiting, diarrhea, constipation, dizziness, abdominal pain, skin rash, fevers, chills, night sweats, weight loss, swollen lymph nodes, body aches, joint swelling, muscle aches, chest pain, shortness of breath, mood changes.   Objective Blood pressure 126/82, pulse 79, height  (1.727 m), weight 264 lb (119.75 kg), SpO2 95.00%.  General: No apparent distress alert and oriented x3 mood and affect normal, dressed appropriately.  HEENT: Pupils equal, extraocular movements intact  Respiratory: Patient's speak in full  sentences and does not appear short of breath  Cardiovascular: No lower extremity edema, non tender, no erythema  Skin: Warm dry intact with no signs of infection or rash on extremities or on axial skeleton.  Abdomen: Soft nontender  Neuro: Cranial nerves II through XII are intact, neurovascularly intact in all extremities with 2+ DTRs and 2+ pulses.  Lymph: No lymphadenopathy of posterior or anterior cervical chain or axillae bilaterally.  Gait normal with good balance and coordination.  MSK:  Non tender with full range of motion and good stability and symmetric strength and tone of shoulders, elbows, wrist, hip, and ankles bilaterally.  Knee: Right  Moderate osteophytic changes noted Tender to palpation of the posterior portion of the knee as well as the medial joint line Range of motion lacks the last 10 of flexion Ligaments with solid consistent endpoints including ACL, PCL, LCL, MCL. Positive Mcmurray's, Apley's, and Thessalonian tests.  painful patellar compression. Patellar glide with moderate crepitus. Patellar and quadriceps tendons unremarkable. Hamstring does have 4-5 strength compared to 5 out of 5 on the contralateral side   MSK US performed of: Right This study was ordered, performed, and interpreted by Terrilee Files D.O.  Knee: All structures visualized. Patient does have severe narrowing of the joint space bilaterally. Bone on bone osteophytic changes seen on the medial joint line. Unable to see patient's meniscal in great detail. Patellar Tendon unremarkable on long and transverse views without effusion. No abnormality of prepatellar bursa. LCL and MCL unremarkable on long and transverse views. No abnormality of origin of medial or lateral head of the  gastrocnemius. No tear appreciated in the hamstring.  IMPRESSION:  Severe osteophytic changes of the knee  Procedure: Real-time Ultrasound Guided Injection of right knee Device: GE Logiq E  Ultrasound guided injection  is preferred based studies that show increased duration, increased effect, greater accuracy, decreased procedural pain, increased response rate, and decreased cost with ultrasound guided versus blind injection.  Verbal informed consent obtained.  Time-out conducted.  Noted no overlying erythema, induration, or other signs of local infection.  Skin prepped in a sterile fashion.  Local anesthesia: Topical Ethyl chloride.  With sterile technique and under real time ultrasound guidance: With a 22-gauge 2 inch needle patient was injected with 4 cc of 0.5% Marcaine and 1 cc of Kenalog 40 mg/dL. This was from a superior lateral approach.  Completed without difficulty  Pain immediately resolved suggesting accurate placement of the medication.  Advised to call if fevers/chills, erythema, induration, drainage, or persistent bleeding.  Images permanently stored and available for review in the ultrasound unit.  Impression: Technically successful ultrasound guided injection.    Impression and Recommendations:     This case required medical decision making of moderate complexity.

## 2013-10-07 NOTE — Assessment & Plan Note (Signed)
The patient was given a compression sleeve today, I believe the patient's pain is mostly mild overall and likely secondary to more of the knee arthritis. Patient given home exercises to try. We discussed over-the-counter medications. Patient will come back again in 3-4 weeks for further evaluation and treatment.

## 2013-10-10 ENCOUNTER — Ambulatory Visit: Payer: BC Managed Care – PPO | Admitting: Family Medicine

## 2013-10-27 ENCOUNTER — Other Ambulatory Visit (INDEPENDENT_AMBULATORY_CARE_PROVIDER_SITE_OTHER): Payer: BC Managed Care – PPO

## 2013-10-27 ENCOUNTER — Encounter: Payer: Self-pay | Admitting: Internal Medicine

## 2013-10-27 ENCOUNTER — Ambulatory Visit (INDEPENDENT_AMBULATORY_CARE_PROVIDER_SITE_OTHER): Payer: BC Managed Care – PPO | Admitting: Internal Medicine

## 2013-10-27 VITALS — BP 140/88 | HR 84 | Temp 98.6°F | Wt 261.1 lb

## 2013-10-27 DIAGNOSIS — L03317 Cellulitis of buttock: Secondary | ICD-10-CM

## 2013-10-27 DIAGNOSIS — Z833 Family history of diabetes mellitus: Secondary | ICD-10-CM

## 2013-10-27 DIAGNOSIS — L0231 Cutaneous abscess of buttock: Secondary | ICD-10-CM

## 2013-10-27 DIAGNOSIS — R7309 Other abnormal glucose: Secondary | ICD-10-CM

## 2013-10-27 LAB — CBC WITH DIFFERENTIAL/PLATELET
Basophils Absolute: 0 10*3/uL (ref 0.0–0.1)
Basophils Relative: 0.3 % (ref 0.0–3.0)
EOS PCT: 1.4 % (ref 0.0–5.0)
Eosinophils Absolute: 0.2 10*3/uL (ref 0.0–0.7)
HEMATOCRIT: 47.2 % (ref 39.0–52.0)
HEMOGLOBIN: 16 g/dL (ref 13.0–17.0)
LYMPHS ABS: 2.1 10*3/uL (ref 0.7–4.0)
Lymphocytes Relative: 15.1 % (ref 12.0–46.0)
MCHC: 33.8 g/dL (ref 30.0–36.0)
MCV: 91 fl (ref 78.0–100.0)
Monocytes Absolute: 0.9 10*3/uL (ref 0.1–1.0)
Monocytes Relative: 6.6 % (ref 3.0–12.0)
NEUTROS ABS: 10.9 10*3/uL — AB (ref 1.4–7.7)
Neutrophils Relative %: 76.6 % (ref 43.0–77.0)
PLATELETS: 278 10*3/uL (ref 150.0–400.0)
RBC: 5.19 Mil/uL (ref 4.22–5.81)
RDW: 14.2 % (ref 11.5–15.5)
WBC: 14.3 10*3/uL — AB (ref 4.0–10.5)

## 2013-10-27 LAB — HEMOGLOBIN A1C: HEMOGLOBIN A1C: 6.5 % (ref 4.6–6.5)

## 2013-10-27 MED ORDER — CEPHALEXIN 500 MG PO CAPS: 500.0000 mg | ORAL_CAPSULE | Freq: Two times a day (BID) | ORAL | Status: DC

## 2013-10-27 MED ORDER — MUPIROCIN 2 % EX OINT: TOPICAL_OINTMENT | CUTANEOUS | Status: DC

## 2013-10-27 NOTE — Progress Notes (Signed)
Pre visit review using our clinic review tool, if applicable. No additional management support is needed unless otherwise documented below in the visit note. 

## 2013-10-27 NOTE — Progress Notes (Signed)
   Subjective:    Patient ID: James Keyvin Risonmale    DOB: Dec 18, 1950, 63 y.o.   MRN: [id number]  HPI   He noted a lump in the inner gluteal area 9/40/15 and soreness while sitting.  The symptoms have not changed.  He has had a history of recurrent abscesses.  He did use topical 20% benzocaine on 9/16.  Yesterday he did have some flulike symptoms.  His brother does have diabetes.   Review of Systems  He denies fever, chills, or sweats.  He also denies polyuria, polyphagia, polydipsia  There's been no change in his weight.       Objective:   Physical Exam Pertinent or positive findings include: Abdomen is markedly protuberant without organomegaly, masses, tenderness 4.5 x 1.5 area of cellulitis in the intergluteal area of the left medial buttock.    General appearance :adequately nourished; in no distress. Eyes: No conjunctival inflammation or scleral icterus is present. Heart:  Normal rate and regular rhythm. S1 and S2 normal without gallop, murmur, click, rub or other extra sounds   Lungs:Chest clear to auscultation; no wheezes, rhonchi,rales ,or rubs present.No increased work of breathing.  Abdomen: bowel sounds normal, soft and non-tender without masses, organomegaly or hernias noted.  No guarding or rebound. Skin:Warm & dry.  Intact without suspicious lesions or rashes ; no jaundice or tenting Lymphatic: No lymphadenopathy is noted about the head, neck, axilla          Assessment & Plan:  #1 cellulitis of the L medial buttock  #2 history of personal  hyperglycemia and family history diabetes  Plan: See orders and recommendations

## 2013-10-27 NOTE — Patient Instructions (Signed)
Soak your buttocks in Sitz bath twice a day and then apply the antibiotic ointment

## 2013-11-01 ENCOUNTER — Encounter: Payer: Self-pay | Admitting: Internal Medicine

## 2013-11-01 ENCOUNTER — Other Ambulatory Visit: Payer: Self-pay | Admitting: Internal Medicine

## 2013-11-01 DIAGNOSIS — L0231 Cutaneous abscess of buttock: Secondary | ICD-10-CM

## 2013-11-23 ENCOUNTER — Telehealth: Payer: Self-pay | Admitting: Family Medicine

## 2013-11-23 NOTE — Telephone Encounter (Signed)
Pt request phone called concern the knee brace that he got when he was in the office. Pt received a bill and the brace is not cover under insurance but Dr. Katrinka BlazingSmith told him it would be no cost to the pt. Please call pt.

## 2013-11-23 NOTE — Telephone Encounter (Signed)
Spoke to pt, I advised him that I would talk to our [employer] rep that comes to the office on Tuesdays. I told him after I talk to TiceRyan about this I will give him a call back next week. Pt understood.

## 2013-12-02 NOTE — Telephone Encounter (Signed)
Information given to Langley Holdings LLCRyan @ [employer]. He stated he would contact the patient himself.

## 2014-01-02 ENCOUNTER — Telehealth: Payer: Self-pay | Admitting: Family Medicine

## 2014-01-02 DIAGNOSIS — M1711 Unilateral primary osteoarthritis, right knee: Secondary | ICD-10-CM

## 2014-01-02 DIAGNOSIS — S76311A Strain of muscle, fascia and tendon of the posterior muscle group at thigh level, right thigh, initial encounter: Secondary | ICD-10-CM

## 2014-01-02 NOTE — Telephone Encounter (Signed)
PT order entered

## 2014-01-02 NOTE — Addendum Note (Signed)
Addended by: Edwena FeltyARSON, LINDSAY T on: 01/02/2014 03:41 PM   Modules accepted: Orders

## 2014-01-02 NOTE — Telephone Encounter (Signed)
Pt send a massage through mychart  Don't know if I have to come in but I'd like to ask Dr. Katrinka BlazingSmith if I can get a prescription for physical therapy for my right leg tendons, hamstring, It Band etc.., Dr. Katrinka BlazingSmith gave me a cortisone shot a few weeks ago but I'm having a tough time walking day to day. If I can get a prescription for therapy [facility] or another facility that Dr. Katrinka BlazingSmith recommends.  Please advise, not sure if she need an appt for this?

## 2014-01-02 NOTE — Telephone Encounter (Signed)
Please refer and then call patient thank you

## 2014-01-23 ENCOUNTER — Other Ambulatory Visit: Payer: Self-pay | Admitting: Internal Medicine

## 2014-01-24 ENCOUNTER — Ambulatory Visit (INDEPENDENT_AMBULATORY_CARE_PROVIDER_SITE_OTHER): Payer: BC Managed Care – PPO | Admitting: Family Medicine

## 2014-01-24 ENCOUNTER — Encounter: Payer: Self-pay | Admitting: Family Medicine

## 2014-01-24 VITALS — BP 122/74 | HR 79 | Ht 68.0 in | Wt 263.0 lb

## 2014-01-24 DIAGNOSIS — M7051 Other bursitis of knee, right knee: Secondary | ICD-10-CM

## 2014-01-24 NOTE — Progress Notes (Signed)
[facility] D[facility] [address] [place], [place] [address] Phone: [phone] Subjective:     CC: Right knee pain follow up  VHQ:[id number]:Subjective James GalMark [name] is a 63 y.o. male coming in with complaint of right knee pain. Patient has known osteoarthritic changes of this  for multiple years. Patient was seen 4 months ago and was given a steroid injection as well as icing protocol, home exercises, bracing and we discussed topical anti-inflammatories. Patient does have tramadol for breakthrough pain. Patient has been doing significant better until the last several weeks. Patient states that he was actually packing for her trip recently and when in extending his knee and felt a sharp pain on the lateral aspect of the knee. This is different than his usual pain. States that yesterday he was unable to walk but today he is doing significantly better. Patient did not try any medications. States that overall its improving but he is concerned with him leaving town.     Past medical history, social, surgical and family history all reviewed in electronic medical record.   Review of Systems: No headache, visual changes, nausea, vomiting, diarrhea, constipation, dizziness, abdominal pain, skin rash, fevers, chills, night sweats, weight loss, swollen lymph nodes, body aches, joint swelling, muscle aches, chest pain, shortness of breath, mood changes.   Objective Blood pressure 122/74, pulse 79, height 5\' 8"  (1.727 m), weight 263 lb (119.296 kg), SpO2 94 %.  General: No apparent distress alert and oriented x3 mood and affect normal, dressed appropriately.  HEENT: Pupils equal, extraocular movements intact  Respiratory: Patient's speak in full sentences and does not appear short of breath  Cardiovascular: No lower extremity edema, non tender, no erythema  Skin: Warm dry intact with no signs of infection or rash on extremities or on axial skeleton.  Abdomen: Soft nontender  Neuro: Cranial  nerves II through XII are intact, neurovascularly intact in all extremities with 2+ DTRs and 2+ pulses.  Lymph: No lymphadenopathy of posterior or anterior cervical chain or axillae bilaterally.  Gait normal with good balance and coordination.  MSK:  Non tender with full range of motion and good stability and symmetric strength and tone of shoulders, elbows, wrist, hip, and ankles bilaterally.  Knee: Right  Tender to palpation of the posterior portion of the knee as well as the medial joint linetender over the lateral joint line which is new Range of motion lacks the last 5 of flexion last 3 of extension. Ligaments with solid consistent endpoints including ACL, PCL, LCL, MCL. Positive Mcmurray's, Apley's, and Thessalonian tests.  painful patellar compression. Patellar glide with moderate crepitus. Patellar and quadriceps tendons unremarkable. Hamstring does have 4-5 strength compared to 5 out of 5 on the contralateral side   MSK US performed of: Right This study was ordered, performed, and interpreted by Terrilee FilesZach Smith D.O.  Knee: All structures visualized. Patient does have severe narrowing of the joint space bilaterally. Bone on bone osteophytic changes seen on the medial joint line. Unable to see patient's meniscal in great detail.patient does have what appears to be significant hypoechoic changes around the popliteus on the lateral aspect. This is consistent with a popliteus subluxation Patellar Tendon unremarkable on long and transverse views without effusion. No abnormality of prepatellar bursa. LCL and MCL unremarkable on long and transverse views. No abnormality of origin of medial or lateral head of the gastrocnemius. No tear appreciated in the hamstring.  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

## 2014-01-24 NOTE — Assessment & Plan Note (Signed)
Patient does have swelling around the popliteal area. I do not see any true bursa but I do see significant hypoechoic changes on ultrasound today. We discussed topical anti-inflammatories, icing protocol, compression. We discussed which activities to avoid. Patient was tried a conservative approach and come back and see me again in 3-4 weeks. If continuing to have pain patient and will come back and we'll consider injection. Underlying osteophytic changes could also be contributing.  Spent greater than 25 minutes with patient face-to-face and had greater than 50% of counseling including as described above in assessment and plan.  Differential also includes lumbar radiculopathy but we will monitor closely.

## 2014-01-24 NOTE — Patient Instructions (Signed)
Popliteus subluxation.  Ice 20 minutes 2 times daily. Usually after activity and before bed.  Try the heel lift right  Compression sleeve Avoid excessive extension next 2 days.  Continue the exercises you doing including foam roller Travel safe!!!

## 2014-07-24 ENCOUNTER — Other Ambulatory Visit: Payer: Self-pay | Admitting: Emergency Medicine

## 2014-07-24 MED ORDER — ATORVASTATIN CALCIUM 20 MG PO TABS: 20.0000 mg | ORAL_TABLET | Freq: Every day | ORAL | Status: DC

## 2014-10-15 ENCOUNTER — Other Ambulatory Visit: Payer: Self-pay | Admitting: Internal Medicine

## 2014-10-18 ENCOUNTER — Other Ambulatory Visit: Payer: Self-pay | Admitting: Emergency Medicine

## 2014-10-18 MED ORDER — ATORVASTATIN CALCIUM 20 MG PO TABS: 20.0000 mg | ORAL_TABLET | Freq: Every day | ORAL | Status: DC

## 2014-11-16 ENCOUNTER — Other Ambulatory Visit: Payer: Self-pay | Admitting: Internal Medicine

## 2014-12-06 ENCOUNTER — Other Ambulatory Visit: Payer: Self-pay | Admitting: Internal Medicine

## 2014-12-11 ENCOUNTER — Ambulatory Visit (INDEPENDENT_AMBULATORY_CARE_PROVIDER_SITE_OTHER): Payer: BLUE CROSS/BLUE SHIELD | Admitting: Internal Medicine

## 2014-12-11 ENCOUNTER — Encounter: Payer: Self-pay | Admitting: Internal Medicine

## 2014-12-11 VITALS — BP 124/72 | HR 66 | Temp 98.7°F | Resp 16 | Ht 68.0 in | Wt 260.0 lb

## 2014-12-11 DIAGNOSIS — F528 Other sexual dysfunction not due to a substance or known physiological condition: Secondary | ICD-10-CM

## 2014-12-11 DIAGNOSIS — R7303 Prediabetes: Secondary | ICD-10-CM | POA: Insufficient documentation

## 2014-12-11 DIAGNOSIS — E785 Hyperlipidemia, unspecified: Secondary | ICD-10-CM | POA: Diagnosis not present

## 2014-12-11 MED ORDER — SILDENAFIL CITRATE 20 MG PO TABS: ORAL_TABLET | ORAL | Status: DC

## 2014-12-11 NOTE — Assessment & Plan Note (Signed)
Lipids, LFTs, TSH  

## 2014-12-11 NOTE — Assessment & Plan Note (Signed)
A1c , urine microalbumin, BMET 

## 2014-12-11 NOTE — Progress Notes (Signed)
Pre visit review using our clinic review tool, if applicable. No additional management support is needed unless otherwise documented below in the visit note. 

## 2014-12-11 NOTE — Progress Notes (Signed)
   Subjective:    Patient ID: James [name], male    DOB: 06/28/1950, 64 y.o.   MRN: [id number]  HPI  The patient is here to assess status of active health conditions.  PMH, FH, & Social History reviewed & updated.No change in FH as recorded.   He is compliant with  medications without adverse effects. Excess intake of red meat, fried foods, or salt denied. Exercise consists of golf 2 times per week. He states he also does strength training at the gym once a week  without associated cardio pulmonary symptoms.  Colonoscopy is up-to-date; he states that this was done in 2011. He will provide us with those records. No GI symptoms present.  Last A1c on record was 6.5% on 10/27/13. He has had no lipids since 10/13/12. At that time HDL was 28.2 and LDL was 141.1.   Review of Systems  Chest pain, palpitations, tachycardia, exertional dyspnea, paroxysmal nocturnal dyspnea, claudication or edema are absent. No unexplained weight loss, abdominal pain, significant dyspepsia, dysphagia, melena, rectal bleeding, or persistently small caliber stools. Dysuria, pyuria, hematuria, frequency, nocturia or polyuria are denied. Change in hair, skin, nails denied. No bowel changes of constipation or diarrhea. No intolerance to heat or cold.  He denies polydipsia or polyphagia. He has no numbness, tingling, burning extremities. He denies any postural hypotension.      Objective:   Physical Exam  Pertinent or positive findings include: BMI 39.54. Pattern alopecia is present. He has a IT consultantgoatee. Complete dentures are present. Abdomen is protuberant. He has DIP OA changes of his hands. He has slight crepitus in the knees.  General appearance :adequately nourished; in no distress.  Eyes: No conjunctival inflammation or scleral icterus is present.  Oral exam:  Lips and gums are healthy appearing.There is no oropharyngeal erythema or exudate noted.  Heart:  Normal rate and regular rhythm. S1 and S2 normal without  gallop, murmur, click, rub or other extra sounds    Lungs:Chest clear to auscultation; no wheezes, rhonchi,rales ,or rubs present.No increased work of breathing.   Abdomen: bowel sounds normal, soft and non-tender without masses, organomegaly or hernias noted.  No guarding or rebound.   Vascular : all pulses equal ; no bruits present.  Skin:Warm & dry.  Intact without suspicious lesions or rashes ; no tenting or jaundice   Lymphatic: No lymphadenopathy is noted about the head, neck, axilla.   Neuro: Strength, tone & DTRs normal.     Assessment & Plan:  See Current Assessment & Plan in Problem List under specific Diagnosis

## 2014-12-11 NOTE — Patient Instructions (Signed)
  Your next office appointment will be determined based upon review of your pending labs. Those written interpretation of the lab results and instructions will be transmitted to you by My Chart  Critical results will be called.   Followup as needed for any active or acute issue. Please report any significant change in your symptoms. 

## 2014-12-12 NOTE — Assessment & Plan Note (Signed)
Renew generic Viagra

## 2014-12-13 ENCOUNTER — Other Ambulatory Visit (INDEPENDENT_AMBULATORY_CARE_PROVIDER_SITE_OTHER): Payer: BLUE CROSS/BLUE SHIELD

## 2014-12-13 DIAGNOSIS — R7303 Prediabetes: Secondary | ICD-10-CM

## 2014-12-13 DIAGNOSIS — E785 Hyperlipidemia, unspecified: Secondary | ICD-10-CM

## 2014-12-13 LAB — MICROALBUMIN / CREATININE URINE RATIO
Creatinine,U: 197.8 mg/dL
MICROALB/CREAT RATIO: 0.5 mg/g (ref 0.0–30.0)
Microalb, Ur: 0.9 mg/dL (ref 0.0–1.9)

## 2014-12-13 LAB — HEPATIC FUNCTION PANEL
ALBUMIN: 3.9 g/dL (ref 3.5–5.2)
ALT: 28 U/L (ref 0–53)
AST: 18 U/L (ref 0–37)
Alkaline Phosphatase: 82 U/L (ref 39–117)
Bilirubin, Direct: 0.1 mg/dL (ref 0.0–0.3)
Total Bilirubin: 0.2 mg/dL (ref 0.2–1.2)
Total Protein: 7.3 g/dL (ref 6.0–8.3)

## 2014-12-13 LAB — BASIC METABOLIC PANEL
BUN: 16 mg/dL (ref 6–23)
CALCIUM: 9.2 mg/dL (ref 8.4–10.5)
CHLORIDE: 107 meq/L (ref 96–112)
CO2: 24 meq/L (ref 19–32)
CREATININE: 0.68 mg/dL (ref 0.40–1.50)
GFR: 125.12 mL/min (ref >60.00)
Glucose, Bld: 117 mg/dL — ABNORMAL HIGH (ref 70–99)
POTASSIUM: 4 meq/L (ref 3.5–5.1)
SODIUM: 140 meq/L (ref 135–145)

## 2014-12-13 LAB — HEMOGLOBIN A1C: HEMOGLOBIN A1C: 6.2 % (ref 4.6–6.5)

## 2014-12-13 LAB — LIPID PANEL
CHOLESTEROL: 175 mg/dL (ref 0–200)
HDL: 32.1 mg/dL — AB (ref >39.00)
NonHDL: 143.28
Total CHOL/HDL Ratio: 5
Triglycerides: 207 mg/dL — ABNORMAL HIGH (ref 0.0–149.0)
VLDL: 41.4 mg/dL — AB (ref 0.0–40.0)

## 2014-12-13 LAB — TSH: TSH: 2.34 u[IU]/mL (ref 0.35–4.50)

## 2014-12-13 LAB — LDL CHOLESTEROL, DIRECT: LDL DIRECT: 127 mg/dL

## 2014-12-17 ENCOUNTER — Other Ambulatory Visit: Payer: Self-pay | Admitting: Internal Medicine

## 2014-12-18 ENCOUNTER — Other Ambulatory Visit: Payer: Self-pay | Admitting: Emergency Medicine

## 2014-12-18 MED ORDER — ATORVASTATIN CALCIUM 20 MG PO TABS: 20.0000 mg | ORAL_TABLET | Freq: Every day | ORAL | Status: DC

## 2015-01-23 ENCOUNTER — Telehealth: Payer: Self-pay

## 2015-01-23 NOTE — Telephone Encounter (Signed)
Left Voice Mail for pt to call back.   RE: Flu Vaccine for 2016  

## 2015-03-15 ENCOUNTER — Ambulatory Visit (INDEPENDENT_AMBULATORY_CARE_PROVIDER_SITE_OTHER): Payer: PRIVATE HEALTH INSURANCE | Admitting: Internal Medicine

## 2015-03-15 ENCOUNTER — Encounter: Payer: Self-pay | Admitting: Internal Medicine

## 2015-03-15 VITALS — BP 144/82 | HR 63 | Temp 97.7°F | Resp 18 | Wt 270.0 lb

## 2015-03-15 DIAGNOSIS — R7303 Prediabetes: Secondary | ICD-10-CM

## 2015-03-15 DIAGNOSIS — E785 Hyperlipidemia, unspecified: Secondary | ICD-10-CM

## 2015-03-15 MED ORDER — ASPIRIN EC 81 MG PO TBEC: 81.0000 mg | DELAYED_RELEASE_TABLET | Freq: Every day | ORAL | Status: DC

## 2015-03-15 MED ORDER — VITAMIN B-12 1000 MCG PO TABS: 1000.0000 ug | ORAL_TABLET | Freq: Every day | ORAL | Status: DC

## 2015-03-15 MED ORDER — ATORVASTATIN CALCIUM 20 MG PO TABS: 20.0000 mg | ORAL_TABLET | Freq: Every day | ORAL | Status: DC

## 2015-03-15 NOTE — Assessment & Plan Note (Signed)
Lipid panel could ideally be better He plans on increasing his exercise and losing weight He will work on lifestyle Continue lipitor 20 mg daily

## 2015-03-15 NOTE — Progress Notes (Signed)
Pre visit review using our clinic review tool, if applicable. No additional management support is needed unless otherwise documented below in the visit note. 

## 2015-03-15 NOTE — Patient Instructions (Signed)
   All other Health Maintenance issues reviewed.   All recommended immunizations and age-appropriate screenings are up-to-date.  No immunizations administered today.   Medications reviewed and updated.  Changes include starting a baby aspirin 81 mg daily.  Your prescription(s) have been submitted to your pharmacy. Please take as directed and contact our office if you believe you are having problem(s) with the medication(s).  Please followup annually

## 2015-03-15 NOTE — Assessment & Plan Note (Signed)
Discussed weight loss - increasing exercise, improving diet and decreasing portions

## 2015-03-15 NOTE — Progress Notes (Signed)
Subjective:    Patient ID: James [name], male    DOB: 24-Sep-1950, 65 y.o.   MRN: [id number]  HPI He is here to establish with a new pcp.  He has no concerns - he does need a refill today.   Prediabetes:  He is not compliant with a low sugar/carb diet.  He is not exercising regularly, but is trying to get back into it.  He knows he needs to lose weight. He has developed some bad eating habits over the past few years.  He retired a little early to care for his wife who has parkinson's and has been eating too much sugar over the past several years.    Hyperlipidemia: He is taking his medication daily. He is not compliant with a low fat/cholesterol diet. He is not exercising regularly. He denies myalgias.   ED:  He takes viagra as needed.  He does not need a refill.   He is smoking, but has cut down.  He is doing some vaping now.  He knows he needs to quit and is working on it.    Medications and allergies reviewed with patient and updated if appropriate.  Patient Active Problem List   Diagnosis Date Noted  . Prediabetes 12/11/2014  . Popliteal bursitis of right knee 01/24/2014  . Primary localized osteoarthrosis, lower leg 10/07/2013  . Right hamstring muscle strain 10/07/2013  . Severe obesity (BMI >= 40) (HCC) 06/23/2013  . Skin tag 10/13/2012  . DEGENERATIVE JOINT DISEASE, KNEES, BILATERAL 06/12/2009  . GANGLION OF TENDON SHEATH 06/12/2009  . Hyperlipidemia 09/27/2008  . 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 ERECTILE DYSFUNCTION 02/24/2006    Current Outpatient Prescriptions on File Prior to Visit  Medication Sig Dispense Refill  . sildenafil (REVATIO) 20 MG tablet 2-5 qd prn 50 tablet 1   No current facility-administered medications on file prior to visit.    Past Medical History  Diagnosis Date  . Arthritis   . Hyperlipidemia   . ED (erectile dysfunction)     Past Surgical History  Procedure Laterality Date  . Dental surgery      full dental replacement  . Knee  arthroscopy  2005    right  . Vasectomy    . Vasectomy      Social History   Social History  . Marital Status: Married    Spouse Name: N/A  . Number of Children: N/A  . Years of Education: N/A   Social History Main Topics  . Smoking status: Light Tobacco Smoker  . Smokeless tobacco: None     Comment: 20 cig/ week  . Alcohol Use: Yes     Comment: rare  . Drug Use: No  . Sexual Activity: Not Asked   Other Topics Concern  . None   Social History Narrative    Family History  Problem Relation Age of Onset  . Hyperlipidemia Mother   . Coronary artery disease Mother   . Heart attack Mother 29  . Diabetes Father     ?  Marland Kitchen Hypertension Father     ?  . Diabetes Brother     Review of Systems  Constitutional: Negative for fever and chills.  Respiratory: Positive for wheezing (getting oof of cig - vaporing). Negative for cough and shortness of breath.   Cardiovascular: Positive for leg swelling (minor). Negative for chest pain and palpitations.  Musculoskeletal: Negative for myalgias.  Neurological: Negative for dizziness, light-headedness and headaches.       Objective:  Filed Vitals:   03/15/15 0829  BP: 144/82  Pulse: 63  Temp: 97.7 F (36.5 C)  Resp: 18   Filed Weights   03/15/15 0829  Weight: 270 lb (122.471 kg)   Body mass index is 41.06 kg/(m^2).   Physical Exam Constitutional: Appears well-developed and well-nourished. No distress.  Neck: Neck supple. No tracheal deviation present. No thyromegaly present.  No carotid bruit. No cervical adenopathy.   Cardiovascular: Normal rate, regular rhythm and normal heart sounds.   No murmur heard.  No edema Pulmonary/Chest: Effort normal and breath sounds normal. No respiratory distress. No wheezes.           Assessment & Plan:   Smoking He is cutting down and use vapor as well Plans on quitting completely Encouraged him to continue his efforts  Many risks for heart disease - advised him to start  a baby ASA daily 81 mg   See Problem List for Assessment and Plan of chronic medical problems.  Follow up annually for  PE   Colonoscopy done about 5 years ago -- no record in chart

## 2015-03-15 NOTE — Assessment & Plan Note (Signed)
Decrease sugars/carbs Decrease portions Start regular exercise Weight loss Follow up annually for a PE

## 2016-05-05 ENCOUNTER — Other Ambulatory Visit: Payer: Self-pay | Admitting: Internal Medicine

## 2016-08-18 ENCOUNTER — Telehealth: Payer: Self-pay | Admitting: Internal Medicine

## 2016-08-18 ENCOUNTER — Other Ambulatory Visit: Payer: Self-pay | Admitting: Internal Medicine

## 2016-08-18 MED ORDER — ATORVASTATIN CALCIUM 20 MG PO TABS: 20.0000 mg | ORAL_TABLET | Freq: Every day | ORAL | 0 refills | Status: DC

## 2016-08-18 NOTE — Telephone Encounter (Signed)
Pt would like a month supply of atorvastatin (LIPITOR) 20 MG tablet  To get him through to his appt on 8/8 with [facility]  [facility] on [address]

## 2016-09-17 ENCOUNTER — Telehealth: Payer: Self-pay | Admitting: Internal Medicine

## 2016-09-17 ENCOUNTER — Encounter: Payer: Self-pay | Admitting: Internal Medicine

## 2016-09-17 ENCOUNTER — Ambulatory Visit (INDEPENDENT_AMBULATORY_CARE_PROVIDER_SITE_OTHER): Payer: PRIVATE HEALTH INSURANCE | Admitting: Internal Medicine

## 2016-09-17 VITALS — BP 134/76 | HR 77 | Temp 98.5°F | Wt 263.0 lb

## 2016-09-17 DIAGNOSIS — R7303 Prediabetes: Secondary | ICD-10-CM

## 2016-09-17 DIAGNOSIS — E7800 Pure hypercholesterolemia, unspecified: Secondary | ICD-10-CM

## 2016-09-17 DIAGNOSIS — F419 Anxiety disorder, unspecified: Secondary | ICD-10-CM | POA: Diagnosis not present

## 2016-09-17 MED ORDER — ATORVASTATIN CALCIUM 20 MG PO TABS: 20.0000 mg | ORAL_TABLET | Freq: Every day | ORAL | 3 refills | Status: DC

## 2016-09-17 MED ORDER — ALPRAZOLAM 0.25 MG PO TABS: 0.2500 mg | ORAL_TABLET | Freq: Two times a day (BID) | ORAL | 1 refills | Status: DC | PRN

## 2016-09-17 NOTE — Assessment & Plan Note (Signed)
Check a1c Low sugar / carb diet Stressed regular exercise   

## 2016-09-17 NOTE — Patient Instructions (Signed)
  Test(s) ordered today. Your results will be released to MyChart (or called to you) after review, usually within 72hours after test completion. If any changes need to be made, you will be notified at that same time.    Medications reviewed and updated.  Changes include starting xanax as needed for anxiety.   Your prescription(s) have been submitted to your pharmacy. Please take as directed and contact our office if you believe you are having problem(s) with the medication(s).    Please followup in one year, sooner if needed

## 2016-09-17 NOTE — Assessment & Plan Note (Signed)
Check lipid panel  Continue daily statin Regular exercise and healthy diet encouraged  

## 2016-09-17 NOTE — Assessment & Plan Note (Signed)
Related to his wife's medical problems - parkinson's and dementia Discussed options - he does not feel he needs a daily medication Will try xanax BID prn - take only as needed Discussed addiction potential, can not take with alcohol, may cause drowsiness

## 2016-09-17 NOTE — Progress Notes (Signed)
Subjective:    Patient ID: James [name], male    DOB: Dec 25, 1950, 66 y.o.   MRN: [id number]  HPI He is here for follow up.  Hyperlipidemia: He is taking his medication daily. He is compliant with a low fat/cholesterol diet. He is exercising regularly. He denies myalgias.   Knee OA:  He continues to have knee pain from arthritis.  He has had injections in the past.    Bump on head;  He noticed a bump on the top of his head two weeks ago.  He was concerned about it being cancer.    Anxiety/stress:   He is having increased anxiety and stress.  Her wife has parkinson's and dementia and has been progressively getting worse. She became septic and ended up in the hospital and is now in rehab.  He is having a hard time taking care of her at home by himself.  He thinks he may need to hire nurses to help.  He did have an episode of chest pain with increased anxiety and went to urgent care and his EKG was ok.  He has not had any since then.  He thinks he needs something to help with his anxiety.  He know it may get worse.  He does not think he needs something daily, only as needed.  The anxiety typically does not last long when he gets it.    Medications and allergies reviewed with patient and updated if appropriate.  Patient Active Problem List   Diagnosis Date Noted  . Prediabetes 12/11/2014  . Popliteal bursitis of right knee 01/24/2014  . Primary localized osteoarthrosis, lower leg 10/07/2013  . Right hamstring muscle strain 10/07/2013  . Severe obesity (BMI >= 40) (HCC) 06/23/2013  . Skin tag 10/13/2012  . DEGENERATIVE JOINT DISEASE, KNEES, BILATERAL 06/12/2009  . GANGLION OF TENDON SHEATH 06/12/2009  . Hyperlipidemia 09/27/2008  . 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 ERECTILE DYSFUNCTION 02/24/2006    Current Outpatient Prescriptions on File Prior to Visit  Medication Sig Dispense Refill  . aspirin EC 81 MG tablet Take 1 tablet (81 mg total) by mouth daily.    . sildenafil  (REVATIO) 20 MG tablet 2-5 qd prn 50 tablet 1  . vitamin B-12 (CYANOCOBALAMIN) 1000 MCG tablet Take 1 tablet (1,000 mcg total) by mouth daily.     No current facility-administered medications on file prior to visit.     Past Medical History:  Diagnosis Date  . Arthritis   . ED (erectile dysfunction)   . Hyperlipidemia     Past Surgical History:  Procedure Laterality Date  . DENTAL SURGERY     full dental replacement  . KNEE ARTHROSCOPY  2005   right  . VASECTOMY    . VASECTOMY      Social History   Social History  . Marital status: Married    Spouse name: N/A  . Number of children: N/A  . Years of education: N/A   Social History Main Topics  . Smoking status: Light Tobacco Smoker  . Smokeless tobacco: Never Used     Comment: 20 cig/ week  . Alcohol use Yes     Comment: rare  . Drug use: No  . Sexual activity: Not Asked   Other Topics Concern  . None   Social History Narrative  . None    Family History  Problem Relation Age of Onset  . Hyperlipidemia Mother   . Coronary artery disease Mother   . Heart attack  Mother [id number]  . Diabetes Father        ?  Marland Kitchen. Hypertension Father        ?  . Diabetes Brother     Review of Systems  Constitutional: Negative for chills and fever.  Respiratory: Negative for cough, shortness of breath and wheezing.   Cardiovascular: Positive for leg swelling. Negative for chest pain (only one episode) and palpitations.  Neurological: Negative for light-headedness and headaches.  Psychiatric/Behavioral: Positive for sleep disturbance. The patient is nervous/anxious.        Objective:   Vitals:   09/17/16 2058  BP: 134/76  Pulse: 77  Temp: 98.5 F (36.9 C)   Filed Weights   09/17/16 2058  Weight: 263 lb (119.3 kg)   Body mass index is 39.99 kg/m.  Wt Readings from Last 3 Encounters:  09/17/16 263 lb (119.3 kg)  03/15/15 270 lb (122.5 kg)  12/11/14 260 lb (117.9 kg)     Physical Exam Constitutional: Appears  well-developed and well-nourished. No distress.  HENT:  Head: Normocephalic and atraumatic.  Neck: Neck supple. No tracheal deviation present. No thyromegaly present.  No cervical lymphadenopathy Cardiovascular: Normal rate, regular rhythm and normal heart sounds.   No murmur heard. No carotid bruit .  LLE > RLE  Edema - mild, non pitting Pulmonary/Chest: Effort normal and breath sounds normal. No respiratory distress. No has no wheezes. No rales.  Skin: Skin is warm and dry. Not diaphoretic. bump on top of head - he will monitor - if persistent will see derm Psychiatric: mildly anxious mood and affect. Behavior is normal.         Assessment & Plan:   See Problem List for Assessment and Plan of chronic medical problems.

## 2016-09-17 NOTE — Telephone Encounter (Signed)
He was here wed when computers were not working.    He needs to come in for blood work at some point - need to be fasting.   Blood work ordered.   Needs to follow up in one year, sooner if needed.

## 2016-09-18 NOTE — Telephone Encounter (Signed)
Spoke with pt to inform.  

## 2016-09-19 ENCOUNTER — Other Ambulatory Visit (INDEPENDENT_AMBULATORY_CARE_PROVIDER_SITE_OTHER): Payer: PRIVATE HEALTH INSURANCE

## 2016-09-19 DIAGNOSIS — R7303 Prediabetes: Secondary | ICD-10-CM | POA: Diagnosis not present

## 2016-09-19 DIAGNOSIS — E7800 Pure hypercholesterolemia, unspecified: Secondary | ICD-10-CM | POA: Diagnosis not present

## 2016-09-19 LAB — CBC WITH DIFFERENTIAL/PLATELET
BASOS ABS: 0.1 10*3/uL (ref 0.0–0.1)
Basophils Relative: 0.6 % (ref 0.0–3.0)
EOS ABS: 0.3 10*3/uL (ref 0.0–0.7)
Eosinophils Relative: 3 % (ref 0.0–5.0)
HCT: 47 % (ref 39.0–52.0)
Hemoglobin: 15.6 g/dL (ref 13.0–17.0)
LYMPHS ABS: 2.5 10*3/uL (ref 0.7–4.0)
LYMPHS PCT: 23.5 % (ref 12.0–46.0)
MCHC: 33.3 g/dL (ref 30.0–36.0)
MCV: 93 fl (ref 78.0–100.0)
Monocytes Absolute: 0.9 10*3/uL (ref 0.1–1.0)
Monocytes Relative: 8.8 % (ref 3.0–12.0)
NEUTROS ABS: 6.8 10*3/uL (ref 1.4–7.7)
NEUTROS PCT: 64.1 % (ref 43.0–77.0)
PLATELETS: 269 10*3/uL (ref 150.0–400.0)
RBC: 5.05 Mil/uL (ref 4.22–5.81)
RDW: 14.1 % (ref 11.5–15.5)
WBC: 10.6 10*3/uL — ABNORMAL HIGH (ref 4.0–10.5)

## 2016-09-19 LAB — COMPREHENSIVE METABOLIC PANEL
ALT: 30 U/L (ref 0–53)
AST: 17 U/L (ref 0–37)
Albumin: 4 g/dL (ref 3.5–5.2)
Alkaline Phosphatase: 79 U/L (ref 39–117)
BILIRUBIN TOTAL: 0.5 mg/dL (ref 0.2–1.2)
BUN: 17 mg/dL (ref 6–23)
CO2: 23 mEq/L (ref 19–32)
CREATININE: 0.68 mg/dL (ref 0.40–1.50)
Calcium: 8.8 mg/dL (ref 8.4–10.5)
Chloride: 110 mEq/L (ref 96–112)
GFR: 124.42 mL/min (ref >60.00)
GLUCOSE: 117 mg/dL — AB (ref 70–99)
Potassium: 4 mEq/L (ref 3.5–5.1)
Sodium: 141 mEq/L (ref 135–145)
Total Protein: 7 g/dL (ref 6.0–8.3)

## 2016-09-19 LAB — LIPID PANEL
CHOLESTEROL: 161 mg/dL (ref 0–200)
HDL: 30.1 mg/dL — ABNORMAL LOW (ref >39.00)
LDL Cholesterol: 102 mg/dL — ABNORMAL HIGH (ref 0–99)
NonHDL: 130.6
TRIGLYCERIDES: 143 mg/dL (ref 0.0–149.0)
Total CHOL/HDL Ratio: 5
VLDL: 28.6 mg/dL (ref 0.0–40.0)

## 2016-09-19 LAB — TSH: TSH: 1.76 u[IU]/mL (ref 0.35–4.50)

## 2016-09-19 LAB — HEMOGLOBIN A1C: Hgb A1c MFr Bld: 6.5 % (ref 4.6–6.5)

## 2016-09-20 ENCOUNTER — Encounter: Payer: Self-pay | Admitting: Internal Medicine

## 2016-09-20 DIAGNOSIS — E119 Type 2 diabetes mellitus without complications: Secondary | ICD-10-CM | POA: Insufficient documentation

## 2016-09-25 ENCOUNTER — Other Ambulatory Visit: Payer: Self-pay | Admitting: Emergency Medicine

## 2016-09-25 MED ORDER — ATORVASTATIN CALCIUM 20 MG PO TABS: 20.0000 mg | ORAL_TABLET | Freq: Every day | ORAL | 3 refills | Status: DC

## 2016-12-12 DIAGNOSIS — L308 Other specified dermatitis: Secondary | ICD-10-CM | POA: Diagnosis not present

## 2016-12-12 DIAGNOSIS — L821 Other seborrheic keratosis: Secondary | ICD-10-CM | POA: Diagnosis not present

## 2017-04-27 ENCOUNTER — Encounter: Payer: Self-pay | Admitting: Family Medicine

## 2017-04-27 ENCOUNTER — Ambulatory Visit (INDEPENDENT_AMBULATORY_CARE_PROVIDER_SITE_OTHER): Payer: Medicare HMO | Admitting: Family Medicine

## 2017-04-27 DIAGNOSIS — M1711 Unilateral primary osteoarthritis, right knee: Secondary | ICD-10-CM | POA: Diagnosis not present

## 2017-04-27 NOTE — Progress Notes (Signed)
Tawana ScaleZach Malissa Slay D.O. [facility] [address] [place], [place] [address] Phone: [phone] Subjective:      CC: Right knee pain.  OZD:[id number]:Subjective  James GalMark [name] is a 67 y.o. male coming in with complaint of right knee pain.  Found to have arthritic changes of the knee.  Patient has had also a popliteus tendinitis.  Has been greater than 3 years since we have seen patient.  Patient states that the pain is severe that is keeping him from certain activities.  History of arthroscopic procedure of the right knee.  States that there is some swelling.  Some increasing instability.  Rates the severity of pain is 7 out of 10     Past Medical History:  Diagnosis Date  . Arthritis   . ED (erectile dysfunction)   . Hyperlipidemia    Past Surgical History:  Procedure Laterality Date  . DENTAL SURGERY     full dental replacement  . KNEE ARTHROSCOPY  2005   right  . VASECTOMY    . VASECTOMY     Social History   Socioeconomic History  . Marital status: Married    Spouse name: Not on file  . Number of children: Not on file  . Years of education: Not on file  . Highest education level: Not on file  Social Needs  . Financial resource strain: Not on file  . Food insecurity - worry: Not on file  . Food insecurity - inability: Not on file  . Transportation needs - medical: Not on file  . Transportation needs - non-medical: Not on file  Occupational History  . Not on file  Tobacco Use  . Smoking status: Light Tobacco Smoker  . Smokeless tobacco: Never Used  . Tobacco comment: 20 cig/ week  Substance and Sexual Activity  . Alcohol use: Yes    Comment: rare  . Drug use: No  . Sexual activity: Not on file  Other Topics Concern  . Not on file  Social History Narrative  . Not on file   Allergies  Allergen Reactions  . Sulfur Rash   Family History  Problem Relation Age of Onset  . Hyperlipidemia Mother   . Coronary artery disease Mother   . Heart attack Mother [id number]  .  Diabetes Father        ?  Marland Kitchen. Hypertension Father        ?  . Diabetes Brother      Past medical history, social, surgical and family history all reviewed in electronic medical record.  No pertanent information unless stated regarding to the chief complaint.   Review of Systems:Review of systems updated and as accurate as of 04/27/17  No headache, visual changes, nausea, vomiting, diarrhea, constipation, dizziness, abdominal pain, skin rash, fevers, chills, night sweats, weight loss, swollen lymph nodes, body aches, joint swelling, chest pain, shortness of breath, mood changes.  Positive muscle aches  Objective    General: No apparent distress alert and oriented x3 mood and affect normal, dressed appropriately.  HEENT: Pupils equal, extraocular movements intact  Respiratory: Patient's speak in full sentences and does not appear short of breath  Cardiovascular: No lower extremity edema, non tender, no erythema  Skin: Warm dry intact with no signs of infection or rash on extremities or on axial skeleton.  Abdomen: Soft nontender  Neuro: Cranial nerves II through XII are intact, neurovascularly intact in all extremities with 2+ DTRs and 2+ pulses.  Lymph: No lymphadenopathy of posterior or anterior cervical  chain or axillae bilaterally.  Gait antalgic MSK:  Non tender with full range of motion and good stability and symmetric strength and tone of shoulders, elbows, wrist, hip, nd ankles bilaterally.  Knee: Right valgus deformity noted. Large thigh to calf ratio.  Tender to palpation over medial and PF joint line.  ROM full in flexion and extension and lower leg rotation. instability with valgus force.  painful patellar compression. Patellar glide with moderate crepitus. Patellar and quadriceps tendons unremarkable. Hamstring and quadriceps strength is normal. Contralateral knee shows mild arthritic changes  After informed written and verbal consent, patient was seated on exam table.  Right knee was prepped with alcohol swab and utilizing anterolateral approach, patient's right knee space was injected with 4:1  marcaine 0.5%: Kenalog 40mg /dL. Patient tolerated the procedure well without immediate complications.   Impression and Recommendations:     This case required medical decision making of moderate complexity.      Note: This dictation was prepared with Dragon dictation along with smaller phrase technology. Any transcriptional errors that result from this process are unintentional.

## 2017-04-27 NOTE — Assessment & Plan Note (Signed)
Patient given injection and tolerated the procedure well.  We discussed icing regimen and home exercises.  Discussed which activities to do which wants to avoid.  Increase activity slowly over the course of next several days.  Follow-up with me again in 4 weeks

## 2017-04-27 NOTE — Patient Instructions (Signed)
Good to see you.  Ice 20 minutes 2 times daily. Usually after activity and before bed. Biofreeze 2 times a day  See me again in 4 weeks

## 2017-05-27 ENCOUNTER — Ambulatory Visit: Payer: PRIVATE HEALTH INSURANCE | Admitting: Family Medicine

## 2017-08-03 ENCOUNTER — Encounter: Payer: Self-pay | Admitting: Family Medicine

## 2017-08-03 ENCOUNTER — Ambulatory Visit (INDEPENDENT_AMBULATORY_CARE_PROVIDER_SITE_OTHER): Payer: Medicare HMO | Admitting: Family Medicine

## 2017-08-03 VITALS — BP 132/78 | HR 68 | Temp 98.4°F | Ht 68.0 in

## 2017-08-03 DIAGNOSIS — M546 Pain in thoracic spine: Secondary | ICD-10-CM

## 2017-08-03 MED ORDER — PREDNISONE 5 MG PO TABS: ORAL_TABLET | ORAL | 0 refills | Status: DC

## 2017-08-03 MED ORDER — CYCLOBENZAPRINE HCL 10 MG PO TABS: 10.0000 mg | ORAL_TABLET | Freq: Three times a day (TID) | ORAL | 0 refills | Status: DC | PRN

## 2017-08-03 NOTE — Patient Instructions (Addendum)
Nice to meet you  Please try the medication. The muscle relaxer can cause sedation.  Please try the exercises  Please continue the heat if you feel that it helps  Please follow up with me in 2 weeks if there isn't improvement.

## 2017-08-03 NOTE — Assessment & Plan Note (Signed)
Pain started on Friday. Unsure if it was related to playing golf or helping take care of his wife.  - prednisone and flexeril  - vimovo samples provided  - counseled on HEP  - if no improvement consider imaging ,PT or trigger point injections.

## 2017-08-03 NOTE — Progress Notes (Signed)
James [name] - 67 y.o. male MRN [id number]  Date of birth: 04/14/1950  SUBJECTIVE:  Including CC & ROS.  Chief Complaint  Patient presents with  . Back Pain    James [name] is a 67 y.o. male that is presenting with back pain. Ongoing for one week. Located mid-upper back on the right. Denies injury or inciting event. He lifts his wife daily-diagnosed with Parkinson's disease. He was playing golf three days ago when he was jolted on the golf cart. Denies tingling or numbness in his legs.  He has been taking Aleve and he had a massage this morning. No radicular symptoms. No history of kidney stones. No shortness of breath     Review of Systems  Constitutional: Negative for fever.  HENT: Negative for congestion.   Respiratory: Negative for cough.   Cardiovascular: Negative for chest pain.  Gastrointestinal: Negative for abdominal pain.  Musculoskeletal: Positive for back pain.  Skin: Negative for color change.  Neurological: Negative for weakness.  Hematological: Negative for adenopathy.  Psychiatric/Behavioral: Negative for agitation.    HISTORY: Past Medical, Surgical, Social, and Family History Reviewed & Updated per EMR.   Pertinent Historical Findings include:  Past Medical History:  Diagnosis Date  . Arthritis   . ED (erectile dysfunction)   . Hyperlipidemia     Past Surgical History:  Procedure Laterality Date  . DENTAL SURGERY     full dental replacement  . KNEE ARTHROSCOPY  2005   right  . VASECTOMY    . VASECTOMY      Allergies  Allergen Reactions  . Sulfur Rash    Family History  Problem Relation Age of Onset  . Hyperlipidemia Mother   . Coronary artery disease Mother   . Heart attack Mother [id number]  . Diabetes Father        ?  Marland Kitchen. Hypertension Father        ?  . Diabetes Brother      Social History   Socioeconomic History  . Marital status: Married    Spouse name: Not on file  . Number of children: Not on file  . Years of education: Not on  file  . Highest education level: Not on file  Occupational History  . Not on file  Social Needs  . Financial resource strain: Not on file  . Food insecurity:    Worry: Not on file    Inability: Not on file  . Transportation needs:    Medical: Not on file    Non-medical: Not on file  Tobacco Use  . Smoking status: Light Tobacco Smoker  . Smokeless tobacco: Never Used  . Tobacco comment: 20 cig/ week  Substance and Sexual Activity  . Alcohol use: Yes    Comment: rare  . Drug use: No  . Sexual activity: Not on file  Lifestyle  . Physical activity:    Days per week: Not on file    Minutes per session: Not on file  . Stress: Not on file  Relationships  . Social connections:    Talks on phone: Not on file    Gets together: Not on file    Attends religious service: Not on file    Active member of club or organization: Not on file    Attends meetings of clubs or organizations: Not on file    Relationship status: Not on file  . Intimate partner violence:    Fear of current or ex partner: Not on file    Emotionally  abused: Not on file    Physically abused: Not on file    Forced sexual activity: Not on file  Other Topics Concern  . Not on file  Social History Narrative  . Not on file     PHYSICAL EXAM:  VS: BP 132/78 (BP Location: Left Arm, Patient Position: Sitting, Cuff Size: Normal)   Pulse 68   Temp 98.4 F (36.9 C) (Oral)   Ht 5\' 8"  (1.727 m)   BMI 39.99 kg/m  Physical Exam Gen: NAD, alert, cooperative with exam, well-appearing ENT: normal lips, normal nasal mucosa,  Eye: normal EOM, normal conjunctiva and lids CV:  no edema, +2 pedal pulses   Resp: no accessory muscle use, non-labored,  GI: no masses or tenderness, no hernia  Skin: no rashes, no areas of induration  Neuro: normal tone, normal sensation to touch Psych:  normal insight, alert and oriented MSK:  Back:  Normal shoulder AROM  Normal scapular motion  TTP at the T6-T8 paraspinal muscles on the  right  No palpable step off  Neurovascularly intact      ASSESSMENT & PLAN:   Acute right-sided thoracic back pain Pain started on Friday. Unsure if it was related to playing golf or helping take care of his wife.  - prednisone and flexeril  - vimovo samples provided  - counseled on HEP  - if no improvement consider imaging ,PT or trigger point injections.

## 2017-08-06 ENCOUNTER — Telehealth: Payer: Self-pay | Admitting: Internal Medicine

## 2017-08-06 NOTE — Telephone Encounter (Signed)
Rebeca please help. Dr. Jordan LikesSchmitz prescribed this Rx 08/03/17.

## 2017-08-06 NOTE — Telephone Encounter (Signed)
Copied from CRM [id number]. Topic: Quick Communication - See Telephone Encounter >> Aug 06, 2017 10:35 AM Burchel, Abbi R wrote: See Telephone encounter for: 08/06/17.  Windell MouldingRuth- [employer] called to ask a clinical question regarding benefits/risks of rx: cyclobenzaprine (FLEXERIL) 10 MG tablet.  Please call back to advise.   CB #: [phone] Reference #: [id number]

## 2017-08-07 ENCOUNTER — Telehealth: Payer: Self-pay | Admitting: Internal Medicine

## 2017-08-07 NOTE — Telephone Encounter (Signed)
Copied from CRM [id number]. Topic: Quick Communication - See Telephone Encounter >> Aug 07, 2017 11:56 AM Lorrine KinMcGee, Stepheni Cameron B, NT wrote: CRM for notification. See Telephone encounter for: 08/07/17. Patient calling and is wanting to know if Dr Magnus SinningSchimitz wanted him to take the naproxen along with the prednisone and muscle relaxer or if he should take the naproxen once fisinished with them? Please advise. CB#: [phone]

## 2017-08-07 NOTE — Telephone Encounter (Signed)
Pt aware of JS instructions/thx dmf

## 2017-11-26 ENCOUNTER — Encounter: Payer: Self-pay | Admitting: Internal Medicine

## 2017-11-28 NOTE — Progress Notes (Addendum)
[name] [address] [place], [place] [address] Phone: [phone] Subjective:   Bruce Donath, am serving as a scribe for Dr. Antoine Primas.   CC: Bilateral knee pain  [id number]  James [name] is a 67 y.o. male coming in with complaint of bilateral knee pain. Right is worse than the left. Has pain with golf and at rest. Pain in right knee is over the medial hamstring tendons and on the medial joint line of the left.  Patient states both of them are giving him trouble.  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  States that the pain is fairly severe.  Waking him up at night.      Past Medical History:  Diagnosis Date  . Arthritis   . ED (erectile dysfunction)   . Hyperlipidemia    Past Surgical History:  Procedure Laterality Date  . DENTAL SURGERY     full dental replacement  . KNEE ARTHROSCOPY  2005   right  . VASECTOMY    . VASECTOMY     Social History   Socioeconomic History  . Marital status: Married    Spouse name: Not on file  . Number of children: Not on file  . Years of education: Not on file  . Highest education level: Not on file  Occupational History  . Not on file  Social Needs  . Financial resource strain: Not on file  . Food insecurity:    Worry: Not on file    Inability: Not on file  . Transportation needs:    Medical: Not on file    Non-medical: Not on file  Tobacco Use  . Smoking status: Light Tobacco Smoker  . Smokeless tobacco: Never Used  . Tobacco comment: 20 cig/ week  Substance and Sexual Activity  . Alcohol use: Yes    Comment: rare  . Drug use: No  . Sexual activity: Not on file  Lifestyle  . Physical activity:    Days per week: Not on file    Minutes per session: Not on file  . Stress: Not on file  Relationships  . Social connections:    Talks on phone: Not on file    Gets together: Not on file    Attends religious service: Not on file    Active member of club or  organization: Not on file    Attends meetings of clubs or organizations: Not on file    Relationship status: Not on file  Other Topics Concern  . Not on file  Social History Narrative  . Not on file   Allergies  Allergen Reactions  . Sulfur Rash   Family History  Problem Relation Age of Onset  . Hyperlipidemia Mother   . Coronary artery disease Mother   . Heart attack Mother 26  . Diabetes Father        ?  Marland Kitchen Hypertension Father        ?  . Diabetes Brother     Current Outpatient Medications (Endocrine & Metabolic):  .  predniSONE (DELTASONE) 5 MG tablet, Take 6 pills for first day, 5 pills second day, 4 pills third day, 3 pills fourth day, 2 pills the fifth day, and 1 pill sixth day.  Current Outpatient Medications (Cardiovascular):  .  atorvastatin (LIPITOR) 20 MG tablet, Take 1 tablet (20 mg total) by mouth daily.     Current Outpatient Medications (Other):  .  cyclobenzaprine (FLEXERIL) 10 MG tablet, Take 1  tablet (10 mg total) by mouth 3 (three) times daily as needed for muscle spasms.    Past medical history, social, surgical and family history all reviewed in electronic medical record.  No pertanent information unless stated regarding to the chief complaint.   Review of Systems:  No headache, visual changes, nausea, vomiting, diarrhea, constipation, dizziness, abdominal pain, skin rash, fevers, chills, night sweats, weight loss, swollen lymph nodes, body aches, joint swelling, muscle aches, chest pain, shortness of breath, mood changes.   Objective  Blood pressure 118/82, pulse 69, height 5\' 8"  (1.727 m), weight 261 lb (118.4 kg), SpO2 96 %.   General: No apparent distress alert and oriented x3 mood and affect normal, dressed appropriately.  HEENT: Pupils equal, extraocular movements intact  Respiratory: Patient's speak in full sentences and does not appear short of breath  Cardiovascular: No lower extremity edema, non tender, no erythema  Skin: Warm dry intact  with no signs of infection or rash on extremities or on axial skeleton.  Abdomen: Soft nontender  Neuro: Cranial nerves II through XII are intact, neurovascularly intact in all extremities with 2+ DTRs and 2+ pulses.  Lymph: No lymphadenopathy of posterior or anterior cervical chain or axillae bilaterally.  Gait antalgic gait MSK:  Non tender with full range of motion and good stability and symmetric strength and tone of shoulders, elbows, wrist, hip, and ankles bilaterally.  Severe OA of knees with instability noted, decrease ROM of 5-10 in both flexion and extension bilaterally. Moderate valgus deformity bilaterally.   After informed written consent patient was lying supine on exam table.  Right knee was prepped with iodine and alcohol swab.  Utilizing superolateral approach, 3 mL of marcaine was used for local anesthesia.  Then using an 18g needle on 60cc syringe, _15 mL of clear straw-colored fluid was aspirated from right knee.  Knee was then injected with 3:1 marcaine:kenalog 40mg /ml, so total of 40 mg of kenalog given .  Patient tolerated procedure well without immediate complications  Procedure note After verbal and written consent given pt was prepped with betadine.  1:3 kenalog 40mg /ml to marcaine used in left knee. This would be 1 mg of kenalog   Pt minimal bleeding dressed with band aid, Pt given red flags to look for pt had better pain control immediatly.       Impression and Recommendations:     This case required medical decision making of moderate complexity. The above documentation has been reviewed and is accurate and complete Judi Saa, DO       Note: This dictation was prepared with Dragon dictation along with smaller phrase technology. Any transcriptional errors that result from this process are unintentional.

## 2017-11-30 ENCOUNTER — Ambulatory Visit (INDEPENDENT_AMBULATORY_CARE_PROVIDER_SITE_OTHER)
Admission: RE | Admit: 2017-11-30 | Discharge: 2017-11-30 | Disposition: A | Discharge status: Home / Self Care | Payer: Medicare HMO | Source: Ambulatory Visit | Attending: Family Medicine | Admitting: Family Medicine

## 2017-11-30 ENCOUNTER — Encounter: Payer: Self-pay | Admitting: Family Medicine

## 2017-11-30 ENCOUNTER — Ambulatory Visit: Payer: Medicare HMO | Admitting: Family Medicine

## 2017-11-30 VITALS — BP 118/82 | HR 69 | Ht 68.0 in | Wt 261.0 lb

## 2017-11-30 DIAGNOSIS — G8929 Other chronic pain: Secondary | ICD-10-CM

## 2017-11-30 DIAGNOSIS — M25562 Pain in left knee: Secondary | ICD-10-CM | POA: Diagnosis not present

## 2017-11-30 DIAGNOSIS — M1710 Unilateral primary osteoarthritis, unspecified knee: Secondary | ICD-10-CM

## 2017-11-30 DIAGNOSIS — M25561 Pain in right knee: Secondary | ICD-10-CM

## 2017-11-30 DIAGNOSIS — M170 Bilateral primary osteoarthritis of knee: Secondary | ICD-10-CM | POA: Diagnosis not present

## 2017-11-30 DIAGNOSIS — IMO0002 Reserved for concepts with insufficient information to code with codable children: Secondary | ICD-10-CM

## 2017-11-30 NOTE — Assessment & Plan Note (Signed)
Discussed icing regimen and home exercises.  Discussed which activities to do which wants to avoid.  Follow-up again in 4 to 8 weeks.  Could be a candidate for Visco supplementation.  Worsening overall.  Declined formal physical therapy.  Spent  25 minutes with patient face-to-face and had greater than 50% of counseling including as described above in assessment and plan.

## 2017-11-30 NOTE — Patient Instructions (Addendum)
Good to see you  Ice is your friend pennsaid pinkie amount topically 2 times daily as needed.   Injected both knees today  Xrays downstairs See me again in 4 weeks and may need other injection

## 2017-12-02 ENCOUNTER — Other Ambulatory Visit: Payer: Self-pay | Admitting: Internal Medicine

## 2017-12-09 ENCOUNTER — Ambulatory Visit (INDEPENDENT_AMBULATORY_CARE_PROVIDER_SITE_OTHER): Payer: Medicare HMO | Admitting: Internal Medicine

## 2017-12-09 ENCOUNTER — Encounter: Payer: Self-pay | Admitting: Internal Medicine

## 2017-12-09 VITALS — BP 128/74 | HR 69 | Temp 98.4°F | Resp 16 | Ht 68.0 in | Wt 256.8 lb

## 2017-12-09 DIAGNOSIS — Z125 Encounter for screening for malignant neoplasm of prostate: Secondary | ICD-10-CM

## 2017-12-09 DIAGNOSIS — F528 Other sexual dysfunction not due to a substance or known physiological condition: Secondary | ICD-10-CM | POA: Diagnosis not present

## 2017-12-09 DIAGNOSIS — Z0000 Encounter for general adult medical examination without abnormal findings: Secondary | ICD-10-CM | POA: Diagnosis not present

## 2017-12-09 DIAGNOSIS — E782 Mixed hyperlipidemia: Secondary | ICD-10-CM

## 2017-12-09 DIAGNOSIS — E119 Type 2 diabetes mellitus without complications: Secondary | ICD-10-CM | POA: Diagnosis not present

## 2017-12-09 DIAGNOSIS — Z23 Encounter for immunization: Secondary | ICD-10-CM

## 2017-12-09 DIAGNOSIS — Z720 Tobacco use: Secondary | ICD-10-CM | POA: Diagnosis not present

## 2017-12-09 MED ORDER — TADALAFIL 20 MG PO TABS: 10.0000 mg | ORAL_TABLET | ORAL | 11 refills | Status: DC | PRN

## 2017-12-09 NOTE — Assessment & Plan Note (Signed)
Has taken sildenafil and Viagra in the past, but would like to try something different Trial of Cialis

## 2017-12-09 NOTE — Assessment & Plan Note (Addendum)
He is exercising minimally and has not been compliant with a diabetic diet Check a1c, urine microalbumin Low sugar / carb diet Stressed regular exercise, weight loss Advised annual eye exam Follow-up in 6 months

## 2017-12-09 NOTE — Assessment & Plan Note (Signed)
Smokes about 1/2 pack/day He knows he should quit and wants to quit, but knows he is not ready to quit-uses it as a stress reliever

## 2017-12-09 NOTE — Patient Instructions (Addendum)
Tests ordered today. Your results will be released to MyChart (or called to you) after review, usually within 72hours after test completion. If any changes need to be made, you will be notified at that same time.  All other Health Maintenance issues reviewed.   All recommended immunizations and age-appropriate screenings are up-to-date or discussed.  Flu immunization administered today.   Medications reviewed and updated.  Changes include :   cialis as needed  Your prescription(s) have been submitted to your pharmacy. Please take as directed and contact our office if you believe you are having problem(s) with the medication(s).    Please followup in 6 months    Health Maintenance, Male A healthy lifestyle and preventive care is important for your health and wellness. Ask your health care provider about what schedule of regular examinations is right for you. What should I know about weight and diet? Eat a Healthy Diet  Eat plenty of vegetables, fruits, whole grains, low-fat dairy products, and lean protein.  Do not eat a lot of foods high in solid fats, added sugars, or salt.  Maintain a Healthy Weight Regular exercise can help you achieve or maintain a healthy weight. You should:  Do at least 150 minutes of exercise each week. The exercise should increase your heart rate and make you sweat (moderate-intensity exercise).  Do strength-training exercises at least twice a week.  Watch Your Levels of Cholesterol and Blood Lipids  Have your blood tested for lipids and cholesterol every 5 years starting at 66 years of age. If you are at high risk for heart disease, you should start having your blood tested when you are 66 years old. You may need to have your cholesterol levels checked more often if: ? Your lipid or cholesterol levels are high. ? You are older than 66 years of age. ? You are at high risk for heart disease.  What should I know about cancer screening? Many types of  cancers can be detected early and may often be prevented. Lung Cancer  You should be screened every year for lung cancer if: ? You are a current smoker who has smoked for at least 30 years. ? You are a former smoker who has quit within the past 15 years.  Talk to your health care provider about your screening options, when you should start screening, and how often you should be screened.  Colorectal Cancer  Routine colorectal cancer screening usually begins at 66 years of age and should be repeated every 5-10 years until you are 66 years old. You may need to be screened more often if early forms of precancerous polyps or small growths are found. Your health care provider may recommend screening at an earlier age if you have risk factors for colon cancer.  Your health care provider may recommend using home test kits to check for hidden blood in the stool.  A small camera at the end of a tube can be used to examine your colon (sigmoidoscopy or colonoscopy). This checks for the earliest forms of colorectal cancer.  Prostate and Testicular Cancer  Depending on your age and overall health, your health care provider may do certain tests to screen for prostate and testicular cancer.  Talk to your health care provider about any symptoms or concerns you have about testicular or prostate cancer.  Skin Cancer  Check your skin from head to toe regularly.  Tell your health care provider about any new moles or changes in moles, especially if: ?  There is a change in a mole's size, shape, or color. ? You have a mole that is larger than a pencil eraser.  Always use sunscreen. Apply sunscreen liberally and repeat throughout the day.  Protect yourself by wearing long sleeves, pants, a wide-brimmed hat, and sunglasses when outside.  What should I know about heart disease, diabetes, and high blood pressure?  If you are 67-16 years of age, have your blood pressure checked every 3-5 years. If you are  42 years of age or older, have your blood pressure checked every year. You should have your blood pressure measured twice-once when you are at a hospital or clinic, and once when you are not at a hospital or clinic. Record the average of the two measurements. To check your blood pressure when you are not at a hospital or clinic, you can use: ? An automated blood pressure machine at a pharmacy. ? A home blood pressure monitor.  Talk to your health care provider about your target blood pressure.  If you are between 38-57 years old, ask your health care provider if you should take aspirin to prevent heart disease.  Have regular diabetes screenings by checking your fasting blood sugar level. ? If you are at a normal weight and have a low risk for diabetes, have this test once every three years after the age of 57. ? If you are overweight and have a high risk for diabetes, consider being tested at a younger age or more often.  A one-time screening for abdominal aortic aneurysm (AAA) by ultrasound is recommended for men aged 65-75 years who are current or former smokers. What should I know about preventing infection? Hepatitis B If you have a higher risk for hepatitis B, you should be screened for this virus. Talk with your health care provider to find out if you are at risk for hepatitis B infection. Hepatitis C Blood testing is recommended for:  Everyone born from [birth year] through 1964.  Anyone with known risk factors for hepatitis C.  Sexually Transmitted Diseases (STDs)  You should be screened each year for STDs including gonorrhea and chlamydia if: ? You are sexually active and are younger than 66 years of age. ? You are older than 65 years of age and your health care provider tells you that you are at risk for this type of infection. ? Your sexual activity has changed since you were last screened and you are at an increased risk for chlamydia or gonorrhea. Ask your health care provider if you  are at risk.  Talk with your health care provider about whether you are at high risk of being infected with HIV. Your health care provider may recommend a prescription medicine to help prevent HIV infection.  What else can I do?  Schedule regular health, dental, and eye exams.  Stay current with your vaccines (immunizations).  Do not use any tobacco products, such as cigarettes, chewing tobacco, and e-cigarettes. If you need help quitting, ask your health care provider.  Limit alcohol intake to no more than 2 drinks per day. One drink equals 12 ounces of beer, 5 ounces of wine, or 1 ounces of hard liquor.  Do not use street drugs.  Do not share needles.  Ask your health care provider for help if you need support or information about quitting drugs.  Tell your health care provider if you often feel depressed.  Tell your health care provider if you have ever been abused or do not feel  safe at home. This information is not intended to replace advice given to you by your health care provider. Make sure you discuss any questions you have with your health care provider. Document Released: 07/26/2007 Document Revised: 09/26/2015 Document Reviewed: 10/31/2014 Elsevier Interactive Patient Education  [employer].

## 2017-12-09 NOTE — Progress Notes (Signed)
Subjective:    Patient ID: James [name], male    DOB: March 02, 1950, 67 y.o.   MRN: [id number]  HPI He is here for a physical exam.   Dealing with knee arthritis - follows with Dr Katrinka Blazing - he just had injections, which helped.   His wife has parkinson's and dementia, which he is dealing with that.   He feels he is doing with a better now, but at times it is difficult.  He has erectile dysfunction and would like to try medication again.  He has tried Viagra and sildenafil in the past and thinks he would like to try something different.    He plays golf.   Medications and allergies reviewed with patient and updated if appropriate.  Patient Active Problem List   Diagnosis Date Noted  . Tobacco abuse 12/09/2017  . Acute right-sided thoracic back pain 08/03/2017  . Degenerative joint disease of knee, right 04/27/2017  . Diabetes mellitus without complication (HCC) 09/20/2016  . Anxiety 09/17/2016  . Popliteal bursitis of right knee 01/24/2014  . Primary localized osteoarthrosis, lower leg 10/07/2013  . Right hamstring muscle strain 10/07/2013  . Severe obesity (BMI >= 40) (HCC) 06/23/2013  . Osteoarthrosis involving lower leg 06/12/2009  . GANGLION OF TENDON SHEATH 06/12/2009  . Hyperlipidemia 09/27/2008  . 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 ERECTILE DYSFUNCTION 02/24/2006    Current Outpatient Medications on File Prior to Visit  Medication Sig Dispense Refill  . atorvastatin (LIPITOR) 20 MG tablet TAKE ONE TABLET BY MOUTH ONE TIME DAILY  30 tablet 0   No current facility-administered medications on file prior to visit.     Past Medical History:  Diagnosis Date  . Arthritis   . ED (erectile dysfunction)   . Hyperlipidemia     Past Surgical History:  Procedure Laterality Date  . DENTAL SURGERY     full dental replacement  . KNEE ARTHROSCOPY  2005   right  . VASECTOMY    . VASECTOMY      Social History   Socioeconomic History  . Marital status: Married   Spouse name: Not on file  . Number of children: Not on file  . Years of education: Not on file  . Highest education level: Not on file  Occupational History  . Not on file  Social Needs  . Financial resource strain: Not on file  . Food insecurity:    Worry: Not on file    Inability: Not on file  . Transportation needs:    Medical: Not on file    Non-medical: Not on file  Tobacco Use  . Smoking status: Light Tobacco Smoker  . Smokeless tobacco: Never Used  . Tobacco comment: 20 cig/ week  Substance and Sexual Activity  . Alcohol use: Yes    Comment: rare  . Drug use: No  . Sexual activity: Not on file  Lifestyle  . Physical activity:    Days per week: Not on file    Minutes per session: Not on file  . Stress: Not on file  Relationships  . Social connections:    Talks on phone: Not on file    Gets together: Not on file    Attends religious service: Not on file    Active member of club or organization: Not on file    Attends meetings of clubs or organizations: Not on file    Relationship status: Not on file  Other Topics Concern  . Not on file  Social History  Narrative  . Not on file    Family History  Problem Relation Age of Onset  . Diabetes Father        ?  Marland Kitchen Hypertension Father        ?  . Diabetes Brother   . Hyperlipidemia Mother   . Coronary artery disease Mother   . Heart attack Mother 24    Review of Systems  Constitutional: Negative for chills and fever.  Eyes: Negative for visual disturbance.  Respiratory: Positive for shortness of breath (up and down stairs). Negative for cough and wheezing.   Cardiovascular: Negative for chest pain, palpitations and leg swelling.  Gastrointestinal: Negative for abdominal pain, blood in stool, constipation, diarrhea and nausea.       No gerd  Genitourinary: Negative for difficulty urinating, dysuria and hematuria.  Musculoskeletal: Positive for arthralgias.  Skin:       Bump on head  Neurological: Negative for  light-headedness and headaches.  Psychiatric/Behavioral: Positive for dysphoric mood (manageable). The patient is nervous/anxious (manageable).        Objective:   Vitals:   12/09/17 1434  BP: 128/74  Pulse: 69  Resp: 16  Temp: 98.4 F (36.9 C)  SpO2: 94%   Filed Weights   12/09/17 1434  Weight: 256 lb 12.8 oz (116.5 kg)   Body mass index is 39.05 kg/m.  Wt Readings from Last 3 Encounters:  12/09/17 256 lb 12.8 oz (116.5 kg)  11/30/17 261 lb (118.4 kg)  09/17/16 263 lb (119.3 kg)     Physical Exam Constitutional: He appears well-developed and well-nourished. No distress.  HENT:  Head: Normocephalic and atraumatic.  Right Ear: External ear normal.  Left Ear: External ear normal.  Mouth/Throat: Oropharynx is clear and moist.  Normal ear canals and TM b/l  Eyes: Conjunctivae and EOM are normal.  Neck: Neck supple. No tracheal deviation present. No thyromegaly present.  No carotid bruit  Cardiovascular: Normal rate, regular rhythm, normal heart sounds and intact distal pulses.   No murmur heard. Pulmonary/Chest: Effort normal and breath sounds normal. No respiratory distress. He has no wheezes. He has no rales.  Abdominal: Soft. He exhibits no distension. There is no tenderness.  Genitourinary: deferred  Musculoskeletal: He exhibits no edema.  Lymphadenopathy:   He has no cervical adenopathy.  Skin: Skin is warm and dry. He is not diaphoretic.  Benign-appearing moles on the med Psychiatric: He has a normal mood and affect. His behavior is normal.    Diabetic Foot Exam - Simple   Simple Foot Form Diabetic Foot exam was performed with the following findings:  Yes 12/09/2017  3:16 PM  Visual Inspection No deformities, no ulcerations, no other skin breakdown bilaterally:  Yes Sensation Testing Intact to touch and monofilament testing bilaterally:  Yes Pulse Check Posterior Tibialis and Dorsalis pulse intact bilaterally:  Yes Comments         Assessment &  Plan:   Physical exam: Screening blood work   ordered Immunizations   flu vaccine today, Prevnar due-he will check to see if he had at the pharmacy, discussed shingles vaccine Colonoscopy up-to-date-due August 2020 Eye exams  Due - advised to schedule EKG   07/2008 Exercise  Playing golf, active at home, no other exercise - will start back at gym Weight  Advised weight loss Skin  Dry skin on fingers, bump on head x 1 year - ? growth Substance abuse    Light cigarette smoker - 1/2 ppd - wants to quit, but it  helps to relief stress - not ready to quit, no other abuse  See Problem List for Assessment and Plan of chronic medical problems.   6 months

## 2017-12-09 NOTE — Assessment & Plan Note (Signed)
Check lipid panel  Continue daily statin Regular exercise and healthy diet encouraged  

## 2017-12-14 ENCOUNTER — Encounter: Payer: Self-pay | Admitting: Internal Medicine

## 2017-12-21 ENCOUNTER — Other Ambulatory Visit (INDEPENDENT_AMBULATORY_CARE_PROVIDER_SITE_OTHER): Payer: Medicare HMO

## 2017-12-21 ENCOUNTER — Encounter: Payer: Self-pay | Admitting: Internal Medicine

## 2017-12-21 DIAGNOSIS — Z125 Encounter for screening for malignant neoplasm of prostate: Secondary | ICD-10-CM | POA: Diagnosis not present

## 2017-12-21 DIAGNOSIS — Z0000 Encounter for general adult medical examination without abnormal findings: Secondary | ICD-10-CM

## 2017-12-21 DIAGNOSIS — E782 Mixed hyperlipidemia: Secondary | ICD-10-CM

## 2017-12-21 DIAGNOSIS — E119 Type 2 diabetes mellitus without complications: Secondary | ICD-10-CM | POA: Diagnosis not present

## 2017-12-21 LAB — PSA, MEDICARE: PSA: 1.71 ng/ml (ref 0.10–4.00)

## 2017-12-21 LAB — COMPREHENSIVE METABOLIC PANEL
ALBUMIN: 4.1 g/dL (ref 3.5–5.2)
ALT: 27 U/L (ref 0–53)
AST: 18 U/L (ref 0–37)
Alkaline Phosphatase: 68 U/L (ref 39–117)
BILIRUBIN TOTAL: 0.3 mg/dL (ref 0.2–1.2)
BUN: 21 mg/dL (ref 6–23)
CHLORIDE: 108 meq/L (ref 96–112)
CO2: 23 meq/L (ref 19–32)
CREATININE: 0.63 mg/dL (ref 0.40–1.50)
Calcium: 8.7 mg/dL (ref 8.4–10.5)
GFR: 135.36 mL/min (ref >60.00)
Glucose, Bld: 110 mg/dL — ABNORMAL HIGH (ref 70–99)
Potassium: 4 mEq/L (ref 3.5–5.1)
SODIUM: 139 meq/L (ref 135–145)
Total Protein: 6.7 g/dL (ref 6.0–8.3)

## 2017-12-21 LAB — LIPID PANEL
Cholesterol: 169 mg/dL (ref 0–200)
HDL: 37.3 mg/dL — ABNORMAL LOW (ref >39.00)
LDL Cholesterol: 116 mg/dL — ABNORMAL HIGH (ref 0–99)
NONHDL: 131.61
Total CHOL/HDL Ratio: 5
Triglycerides: 77 mg/dL (ref 0.0–149.0)
VLDL: 15.4 mg/dL (ref 0.0–40.0)

## 2017-12-21 LAB — CBC WITH DIFFERENTIAL/PLATELET
BASOS ABS: 0.1 10*3/uL (ref 0.0–0.1)
Basophils Relative: 0.8 % (ref 0.0–3.0)
EOS ABS: 0.2 10*3/uL (ref 0.0–0.7)
Eosinophils Relative: 1.3 % (ref 0.0–5.0)
HCT: 45.5 % (ref 39.0–52.0)
Hemoglobin: 15.4 g/dL (ref 13.0–17.0)
LYMPHS PCT: 19.9 % (ref 12.0–46.0)
Lymphs Abs: 2.3 10*3/uL (ref 0.7–4.0)
MCHC: 33.8 g/dL (ref 30.0–36.0)
MCV: 93.3 fl (ref 78.0–100.0)
MONO ABS: 1 10*3/uL (ref 0.1–1.0)
Monocytes Relative: 8.2 % (ref 3.0–12.0)
NEUTROS ABS: 8.2 10*3/uL — AB (ref 1.4–7.7)
Neutrophils Relative %: 69.8 % (ref 43.0–77.0)
Platelets: 237 10*3/uL (ref 150.0–400.0)
RBC: 4.88 Mil/uL (ref 4.22–5.81)
RDW: 14 % (ref 11.5–15.5)
WBC: 11.8 10*3/uL — AB (ref 4.0–10.5)

## 2017-12-21 LAB — MICROALBUMIN / CREATININE URINE RATIO
Creatinine,U: 102 mg/dL
MICROALB UR: 1 mg/dL (ref 0.0–1.9)
Microalb Creat Ratio: 1 mg/g (ref 0.0–30.0)

## 2017-12-21 LAB — TSH: TSH: 1.85 u[IU]/mL (ref 0.35–4.50)

## 2017-12-21 LAB — HEMOGLOBIN A1C: HEMOGLOBIN A1C: 6.6 % — AB (ref 4.6–6.5)

## 2018-01-04 DIAGNOSIS — L0231 Cutaneous abscess of buttock: Secondary | ICD-10-CM | POA: Diagnosis not present

## 2018-01-25 ENCOUNTER — Other Ambulatory Visit: Payer: Self-pay | Admitting: Internal Medicine

## 2018-02-04 ENCOUNTER — Other Ambulatory Visit: Payer: Self-pay | Admitting: Pharmacist

## 2018-02-04 NOTE — Patient Outreach (Signed)
[facility] Care Management  02/04/2018  Ardyth GalMark Haun 09/07/1950 [id number]   Called patient regarding medication adherence to atorvastatin.  Patient would not verify HIPAA but when I told him I was a pharmacist [facility], he immediately said "I had my atorvastatin filled three days ago".  I will notate the patients's response on the spreadsheet from [facility].  Beecher McardleKatina J. Laci Frenkel, PharmD, BCACP [facility] Clinical Pharmacist [phone]

## 2018-04-05 ENCOUNTER — Encounter: Payer: Self-pay | Admitting: Family Medicine

## 2018-04-07 ENCOUNTER — Encounter: Payer: Self-pay | Admitting: Family Medicine

## 2018-04-07 DIAGNOSIS — R252 Cramp and spasm: Secondary | ICD-10-CM | POA: Diagnosis not present

## 2018-04-07 NOTE — Telephone Encounter (Signed)
Left message for patient to call back to schedule appointment.

## 2018-04-20 ENCOUNTER — Encounter: Payer: Self-pay | Admitting: Family Medicine

## 2018-05-10 ENCOUNTER — Encounter: Payer: Self-pay | Admitting: Internal Medicine

## 2018-05-19 ENCOUNTER — Encounter: Payer: Self-pay | Admitting: *Deleted

## 2018-05-21 NOTE — Progress Notes (Signed)
James [name] D.O. [facility] [address] [place], [place] [address] Phone: [phone] Subjective:    James [name], James [name], am serving as a scribe for Dr. Antoine PrimasZachary [name].    CC: Right knee pain follow-up  [id number]:Subjective  James [name] is a 68 y.o. male coming in with complaint of bilateral knee pain. Patient states that he has been having pain in both knees.   Patient has known severe arthritis of the right knee.  Last injections were in October.  Started having increasing pain and swelling again.  Feels more tightness of the hamstring.  Rates the severity of pain is 9 out of 10.  States that the left knee is starting to hurt as well.    Past Medical History:  Diagnosis Date  . Arthritis   . ED (erectile dysfunction)   . Hyperlipidemia    Past Surgical History:  Procedure Laterality Date  . DENTAL SURGERY     full dental replacement  . KNEE ARTHROSCOPY  2005   right  . VASECTOMY    . VASECTOMY     Social History   Socioeconomic History  . Marital status: Married    Spouse name: Not on file  . Number of children: Not on file  . Years of education: Not on file  . Highest education level: Not on file  Occupational History  . Not on file  Social Needs  . Financial resource strain: Not on file  . Food insecurity:    Worry: Not on file    Inability: Not on file  . Transportation needs:    Medical: Not on file    Non-medical: Not on file  Tobacco Use  . Smoking status: Light Tobacco Smoker  . Smokeless tobacco: Never Used  . Tobacco comment: 20 cig/ week  Substance and Sexual Activity  . Alcohol use: Yes    Comment: rare  . Drug use: No  . Sexual activity: Not on file  Lifestyle  . Physical activity:    Days per week: Not on file    Minutes per session: Not on file  . Stress: Not on file  Relationships  . Social connections:    Talks on phone: Not on file    Gets together: Not on file    Attends religious service: Not on file    Active member of  club or organization: Not on file    Attends meetings of clubs or organizations: Not on file    Relationship status: Not on file  Other Topics Concern  . Not on file  Social History Narrative  . Not on file   Allergies  Allergen Reactions  . Sulfur Rash   Family History  Problem Relation Age of Onset  . Diabetes Father        ?  Marland Kitchen. Hypertension Father        ?  . Diabetes Brother   . Hyperlipidemia Mother   . Coronary artery disease Mother   . Heart attack Mother [id number]     Current Outpatient Medications (Cardiovascular):  .  atorvastatin (LIPITOR) 20 MG tablet, TAKE ONE TABLET BY MOUTH ONE TIME DAILY  .  tadalafil (ADCIRCA/CIALIS) 20 MG tablet, Take 0.5-1 tablets (10-20 mg total) by mouth every other day as needed for erectile dysfunction.        Past medical history, social, surgical and family history all reviewed in electronic medical record.  No pertanent information unless stated regarding to the chief complaint.   Review of  Systems:  No headache, visual changes, nausea, vomiting, diarrhea, constipation, dizziness, abdominal pain, skin rash, fevers, chills, night sweats, weight loss, swollen lymph nodes, body aches, joint swelling,  chest pain, shortness of breath, mood changes.  Positive muscle aches  Objective  Blood pressure 112/64, height 5\' 8"  (1.727 m), weight 256 lb (116.1 kg).    General: No apparent distress alert and oriented x3 mood and affect normal, dressed appropriately.  Patient as well as provider wearing masks HEENT: Pupils equal, extraocular movements intact  Respiratory: Patient's speak in full sentences and does not appear short of breath  Cardiovascular: No lower extremity edema, non tender, no erythema  Skin: Warm dry intact with no signs of infection or rash on extremities or on axial skeleton.  Abdomen: Soft nontender  Neuro: Cranial nerves II through XII are intact, neurovascularly intact in all extremities with 2+ DTRs and 2+ pulses.   Lymph: No lymphadenopathy of posterior or anterior cervical chain or axillae bilaterally.  Gait antalgic MSK:  tender with full range of motion and good stability and symmetric strength and tone of shoulders, elbows, wrist, hip and ankles bilaterally.  Knee:bilateral valgus deformity noted. Large thigh to calf ratio.  Right greater then left  Tender to palpation over medial and PF joint line.  Right greater than left ROM full in flexion and extension and lower leg rotation. instability with valgus force.  painful patellar compression. Patellar glide with moderate crepitus. Patellar and quadriceps tendons unremarkable. Hamstring and quadriceps strength is normal.   After informed written and verbal consent, patient was seated on exam table. Right knee was prepped with alcohol swab and utilizing anterolateral approach, patient's right knee space was injected with 4:1  marcaine 0.5%: Kenalog 40mg /dL. Patient tolerated the procedure well without immediate complications.  After informed written and verbal consent, patient was seated on exam table. Left knee was prepped with alcohol swab and utilizing anterolateral approach, patient's left knee space was injected with 4:1  marcaine 0.5%: Kenalog 40mg /dL. Patient tolerated the procedure well without immediate complications.   Impression and Recommendations:     This case required medical decision making of moderate complexity. The above documentation has been reviewed and is accurate and complete James Saa, DO       Note: This dictation was prepared with Dragon dictation along with smaller phrase technology. Any transcriptional errors that result from this process are unintentional.

## 2018-05-24 ENCOUNTER — Encounter: Payer: Self-pay | Admitting: Family Medicine

## 2018-05-24 ENCOUNTER — Ambulatory Visit (INDEPENDENT_AMBULATORY_CARE_PROVIDER_SITE_OTHER): Payer: Medicare HMO | Admitting: Family Medicine

## 2018-05-24 ENCOUNTER — Other Ambulatory Visit: Payer: Self-pay

## 2018-05-24 DIAGNOSIS — M170 Bilateral primary osteoarthritis of knee: Secondary | ICD-10-CM | POA: Insufficient documentation

## 2018-05-24 NOTE — Patient Instructions (Signed)
Good to see you  Ice 20 minutes 2 times daily. Usually after activity and before bed.  

## 2018-05-24 NOTE — Assessment & Plan Note (Addendum)
Bilateral injections given today.  Discussed icing regimen and home exercise.  Discussed which activities of doing which wants to avoid.  Patient would be a candidate for Visco supplementation.  We will try to get prior approval for this.  Patient will come back to me again 1 weeks patient was offered virtual visit previously.  Patient's knee pain was bad enough that he was going to go to the emergency room.  Patient is a medium risk for the COVID-19.  Patient then came in today because of the pain.

## 2018-06-04 ENCOUNTER — Encounter: Payer: Self-pay | Admitting: Family Medicine

## 2018-06-08 NOTE — Progress Notes (Signed)
James [name] [address] [place], [place] [address] Phone: [phone] Subjective:   James [name], am serving as a scribe for Dr. Antoine Primas.    CC: Bilateral knee pain follow-up  [id number]  James [name] is a 68 y.o. male coming in with complaint of bilateral knee pain. Patient has had continued pain in his knees. Did feel somewhat better after steroid injection. Is here for visco injection today.  Patient did feel about 25% better since the injection.  Still having some discomfort and pain.  Looking for something else to help him so he can continue to stay active and be the primary caregiver for his ailing wife.      Past Medical History:  Diagnosis Date  . Arthritis   . ED (erectile dysfunction)   . Hyperlipidemia    Past Surgical History:  Procedure Laterality Date  . DENTAL SURGERY     full dental replacement  . KNEE ARTHROSCOPY  2005   right  . VASECTOMY    . VASECTOMY     Social History   Socioeconomic History  . Marital status: Married    Spouse name: Not on file  . Number of children: Not on file  . Years of education: Not on file  . Highest education level: Not on file  Occupational History  . Not on file  Social Needs  . Financial resource strain: Not on file  . Food insecurity:    Worry: Not on file    Inability: Not on file  . Transportation needs:    Medical: Not on file    Non-medical: Not on file  Tobacco Use  . Smoking status: Light Tobacco Smoker  . Smokeless tobacco: Never Used  . Tobacco comment: 20 cig/ week  Substance and Sexual Activity  . Alcohol use: Yes    Comment: rare  . Drug use: No  . Sexual activity: Not on file  Lifestyle  . Physical activity:    Days per week: Not on file    Minutes per session: Not on file  . Stress: Not on file  Relationships  . Social connections:    Talks on phone: Not on file    Gets together: Not on file    Attends religious service: Not on file   Active member of club or organization: Not on file    Attends meetings of clubs or organizations: Not on file    Relationship status: Not on file  Other Topics Concern  . Not on file  Social History Narrative  . Not on file   Allergies  Allergen Reactions  . Sulfur Rash   Family History  Problem Relation Age of Onset  . Diabetes Father        ?  Marland Kitchen Hypertension Father        ?  . Diabetes Brother   . Hyperlipidemia Mother   . Coronary artery disease Mother   . Heart attack Mother 61     Current Outpatient Medications (Cardiovascular):  .  atorvastatin (LIPITOR) 20 MG tablet, TAKE ONE TABLET BY MOUTH ONE TIME DAILY  .  tadalafil (ADCIRCA/CIALIS) 20 MG tablet, Take 0.5-1 tablets (10-20 mg total) by mouth every other day as needed for erectile dysfunction.        Past medical history, social, surgical and family history all reviewed in electronic medical record.  No pertanent information unless stated regarding to the chief complaint.   Review of Systems:  No headache, visual  changes, nausea, vomiting, diarrhea, constipation, dizziness, abdominal pain, skin rash, fevers, chills, night sweats, weight loss, swollen lymph nodes, body aches, chest pain, shortness of breath, mood changes.  Positive muscle aches and joint swelling  Objective  Blood pressure 118/72, height 5\' 8"  (1.727 m), weight 256 lb (116.1 kg).    General: No apparent distress alert and oriented x3 mood and affect normal, dressed appropriately.  HEENT: Pupils equal, extraocular movements intact  Respiratory: Patient's speak in full sentences and does not appear short of breath  Cardiovascular: No lower extremity edema, non tender, no erythema  Skin: Warm dry intact with no signs of infection or rash on extremities or on axial skeleton.  Abdomen: Soft nontender  Neuro: Cranial nerves II through XII are intact, neurovascularly intact in all extremities with 2+ DTRs and 2+ pulses.  Lymph: No lymphadenopathy  of posterior or anterior cervical chain or axillae bilaterally.  Gait normal with good balance and coordination.  MSK:  Non tender with full range of motion and good stability and symmetric strength and tone of shoulders, elbows, wrist, hip, and ankles bilaterally.  Knee: valgus deformity noted. Large thigh to calf ratio.  Tender to palpation over medial and PF joint line.  ROM full in flexion and extension and lower leg rotation. instability with valgus force.  painful patellar compression. Patellar glide with moderate crepitus. Patellar and quadriceps tendons unremarkable. Hamstring and quadriceps strength is normal.  After informed written and verbal consent, patient was seated on exam table. Right knee was prepped with alcohol swab and utilizing anterolateral approach, patient's right knee space was injected with22 mg/1 mL of Monovisc (sodium hyaluronate) in a prefilled syringe was injected easily into the knee through a 22-gauge needle..Patient tolerated the procedure well without immediate complications.  After informed written and verbal consent, patient was seated on exam table. Left knee was prepped with alcohol swab and utilizing anterolateral approach, patient's left knee space was injected with 22mg /mL of monovisc (sodium hyaluronate) in a prefilled syringe was injected easily into the knee through a 22-gauge needle..Patient tolerated the procedure well without immediate complications.    Impression and Recommendations:     This case required medical decision making of moderate complexity. The above documentation has been reviewed and is accurate and complete James SaaZachary M Asheton Viramontes, DO       Note: This dictation was prepared with Dragon dictation along with smaller phrase technology. Any transcriptional errors that result from this process are unintentional.

## 2018-06-09 ENCOUNTER — Encounter: Payer: Self-pay | Admitting: Family Medicine

## 2018-06-09 ENCOUNTER — Ambulatory Visit: Payer: Medicare HMO | Admitting: Family Medicine

## 2018-06-09 ENCOUNTER — Other Ambulatory Visit: Payer: Self-pay

## 2018-06-09 DIAGNOSIS — M170 Bilateral primary osteoarthritis of knee: Secondary | ICD-10-CM | POA: Diagnosis not present

## 2018-06-09 NOTE — Assessment & Plan Note (Signed)
Bilateral injection given today.  Tolerated the procedure well.  Discussed icing regimen and home exercise.  Discussed which activities to do which wants to avoid.  Patient will increase activity slowly.  Follow-up again in 4 to 8 weeks.

## 2018-06-14 ENCOUNTER — Ambulatory Visit: Payer: Medicare HMO | Admitting: Internal Medicine

## 2018-06-17 ENCOUNTER — Encounter: Payer: Self-pay | Admitting: Family Medicine

## 2018-07-09 DIAGNOSIS — J810 Acute pulmonary edema: Secondary | ICD-10-CM | POA: Diagnosis not present

## 2018-07-09 DIAGNOSIS — K5730 Diverticulosis of large intestine without perforation or abscess without bleeding: Secondary | ICD-10-CM | POA: Diagnosis not present

## 2018-07-09 DIAGNOSIS — I10 Essential (primary) hypertension: Secondary | ICD-10-CM | POA: Diagnosis not present

## 2018-07-09 DIAGNOSIS — I452 Bifascicular block: Secondary | ICD-10-CM | POA: Diagnosis not present

## 2018-07-09 DIAGNOSIS — R1084 Generalized abdominal pain: Secondary | ICD-10-CM | POA: Diagnosis not present

## 2018-07-09 DIAGNOSIS — R0989 Other specified symptoms and signs involving the circulatory and respiratory systems: Secondary | ICD-10-CM | POA: Diagnosis not present

## 2018-07-09 DIAGNOSIS — I517 Cardiomegaly: Secondary | ICD-10-CM | POA: Diagnosis not present

## 2018-07-09 DIAGNOSIS — R52 Pain, unspecified: Secondary | ICD-10-CM | POA: Diagnosis not present

## 2018-07-09 DIAGNOSIS — N132 Hydronephrosis with renal and ureteral calculous obstruction: Secondary | ICD-10-CM | POA: Diagnosis not present

## 2018-07-09 DIAGNOSIS — I700 Atherosclerosis of aorta: Secondary | ICD-10-CM | POA: Diagnosis not present

## 2018-07-09 DIAGNOSIS — N400 Enlarged prostate without lower urinary tract symptoms: Secondary | ICD-10-CM | POA: Diagnosis not present

## 2018-07-09 DIAGNOSIS — E669 Obesity, unspecified: Secondary | ICD-10-CM | POA: Diagnosis not present

## 2018-07-09 DIAGNOSIS — I4519 Other right bundle-branch block: Secondary | ICD-10-CM | POA: Diagnosis not present

## 2018-07-10 DIAGNOSIS — I452 Bifascicular block: Secondary | ICD-10-CM | POA: Diagnosis not present

## 2018-07-10 DIAGNOSIS — I4519 Other right bundle-branch block: Secondary | ICD-10-CM | POA: Diagnosis not present

## 2018-07-10 DIAGNOSIS — N132 Hydronephrosis with renal and ureteral calculous obstruction: Secondary | ICD-10-CM | POA: Diagnosis not present

## 2018-07-10 DIAGNOSIS — K5730 Diverticulosis of large intestine without perforation or abscess without bleeding: Secondary | ICD-10-CM | POA: Diagnosis not present

## 2018-07-10 DIAGNOSIS — R0989 Other specified symptoms and signs involving the circulatory and respiratory systems: Secondary | ICD-10-CM | POA: Diagnosis not present

## 2018-07-12 DIAGNOSIS — N201 Calculus of ureter: Secondary | ICD-10-CM | POA: Diagnosis not present

## 2018-07-12 DIAGNOSIS — R7309 Other abnormal glucose: Secondary | ICD-10-CM | POA: Diagnosis not present

## 2018-07-12 DIAGNOSIS — E669 Obesity, unspecified: Secondary | ICD-10-CM | POA: Diagnosis not present

## 2018-07-12 DIAGNOSIS — Z6838 Body mass index (BMI) 38.0-38.9, adult: Secondary | ICD-10-CM | POA: Diagnosis not present

## 2018-07-12 DIAGNOSIS — N200 Calculus of kidney: Secondary | ICD-10-CM | POA: Diagnosis not present

## 2018-07-14 ENCOUNTER — Encounter: Payer: Self-pay | Admitting: Family Medicine

## 2018-07-14 ENCOUNTER — Encounter: Payer: Self-pay | Admitting: Internal Medicine

## 2018-07-15 DIAGNOSIS — E669 Obesity, unspecified: Secondary | ICD-10-CM | POA: Diagnosis not present

## 2018-07-15 DIAGNOSIS — Z6838 Body mass index (BMI) 38.0-38.9, adult: Secondary | ICD-10-CM | POA: Diagnosis not present

## 2018-07-15 DIAGNOSIS — N200 Calculus of kidney: Secondary | ICD-10-CM | POA: Diagnosis not present

## 2018-07-19 ENCOUNTER — Ambulatory Visit: Payer: Medicare HMO | Admitting: Internal Medicine

## 2018-07-21 ENCOUNTER — Ambulatory Visit: Payer: Medicare HMO | Admitting: Family Medicine

## 2018-07-21 ENCOUNTER — Encounter: Payer: Self-pay | Admitting: Family Medicine

## 2018-07-21 ENCOUNTER — Ambulatory Visit: Payer: Self-pay

## 2018-07-21 ENCOUNTER — Other Ambulatory Visit: Payer: Self-pay

## 2018-07-21 VITALS — BP 140/80 | HR 76 | Ht 68.0 in | Wt 258.0 lb

## 2018-07-21 DIAGNOSIS — M170 Bilateral primary osteoarthritis of knee: Secondary | ICD-10-CM

## 2018-07-21 DIAGNOSIS — M7660 Achilles tendinitis, unspecified leg: Secondary | ICD-10-CM

## 2018-07-21 DIAGNOSIS — S86011A Strain of right Achilles tendon, initial encounter: Secondary | ICD-10-CM | POA: Diagnosis not present

## 2018-07-21 MED ORDER — NITROGLYCERIN 0.2 MG/HR TD PT24: MEDICATED_PATCH | TRANSDERMAL | 1 refills | Status: AC

## 2018-07-21 NOTE — Assessment & Plan Note (Signed)
Partial tear.  Patient needs to remain nonweightbearing he states because he is the primary caregiver for his wife.  Cam walker given today.  Discussed heel lift, topical anti-inflammatories and icing regimen.  Nitroglycerin patch and warned of potential side effects.  Encourage weight loss.  Will take 6 to 12 weeks to heal.  Follow-up again in 3 to 4 weeks

## 2018-07-21 NOTE — Assessment & Plan Note (Signed)
Improvement from previous exam

## 2018-07-21 NOTE — Progress Notes (Signed)
Tawana ScaleZach  D.O.  [facility] [address] [place], [place] [address] Phone: [phone] Subjective:   I Ronelle NighKana Thompson am serving as a Neurosurgeonscribe for Dr. Antoine PrimasZachary .    CC: Knee pain follow-up  [id number]:Subjective  James GalMark [name] is a 68 y.o. male coming in with complaint of bilateral knee pain. Achilles tendon on the right side is painful. Believes he strained his achilles. Has been trying to stay off his foot.   Onset- 10 days ago  Location- distal achilles  Duration-  Character-  Aggravating factors- walking  Reliving factors-  Therapies tried- ice, oral meds Severity- 9/10 initially      Past Medical History:  Diagnosis Date  . Arthritis   . ED (erectile dysfunction)   . Hyperlipidemia    Past Surgical History:  Procedure Laterality Date  . DENTAL SURGERY     full dental replacement  . KNEE ARTHROSCOPY  2005   right  . VASECTOMY    . VASECTOMY     Social History   Socioeconomic History  . Marital status: Married    Spouse name: Not on file  . Number of children: Not on file  . Years of education: Not on file  . Highest education level: Not on file  Occupational History  . Not on file  Social Needs  . Financial resource strain: Not on file  . Food insecurity:    Worry: Not on file    Inability: Not on file  . Transportation needs:    Medical: Not on file    Non-medical: Not on file  Tobacco Use  . Smoking status: Light Tobacco Smoker  . Smokeless tobacco: Never Used  . Tobacco comment: 20 cig/ week  Substance and Sexual Activity  . Alcohol use: Yes    Comment: rare  . Drug use: No  . Sexual activity: Not on file  Lifestyle  . Physical activity:    Days per week: Not on file    Minutes per session: Not on file  . Stress: Not on file  Relationships  . Social connections:    Talks on phone: Not on file    Gets together: Not on file    Attends religious service: Not on file    Active member of club or organization: Not on file     Attends meetings of clubs or organizations: Not on file    Relationship status: Not on file  Other Topics Concern  . Not on file  Social History Narrative  . Not on file   Allergies  Allergen Reactions  . Sulfur Rash   Family History  Problem Relation Age of Onset  . Diabetes Father        ?  Marland Kitchen. Hypertension Father        ?  . Diabetes Brother   . Hyperlipidemia Mother   . Coronary artery disease Mother   . Heart attack Mother [id number]     Current Outpatient Medications (Cardiovascular):  .  atorvastatin (LIPITOR) 20 MG tablet, TAKE ONE TABLET BY MOUTH ONE TIME DAILY  .  tadalafil (ADCIRCA/CIALIS) 20 MG tablet, Take 0.5-1 tablets (10-20 mg total) by mouth every other day as needed for erectile dysfunction.        Past medical history, social, surgical and family history all reviewed in electronic medical record.  No pertanent information unless stated regarding to the chief complaint.   Review of Systems:  No headache, visual changes, nausea, vomiting, diarrhea, constipation, dizziness, abdominal pain, skin rash,  fevers, chills, night sweats, weight loss, swollen lymph nodes, body aches, joint swelling, muscle aches, chest pain, shortness of breath, mood changes.   Objective  There were no vitals taken for this visit.    General: No apparent distress alert and oriented x3 mood and affect normal, dressed appropriately.  HEENT: Pupils equal, extraocular movements intact  Respiratory: Patient's speak in full sentences and does not appear short of breath  Cardiovascular: 1+lower extremity edema, non tender, no erythema  Skin: Warm dry intact with no signs of infection or rash on extremities or on axial skeleton.  Abdomen: Soft nontender  Neuro: Cranial nerves II through XII are intact, neurovascularly intact in all extremities with 2+ DTRs and 2+ pulses.  Lymph: No lymphadenopathy of posterior or anterior cervical chain or axillae bilaterally.  Gait severely antalgic MSK:   tender with full range of motion and good stability and symmetric strength and tone of shoulders, elbows, wrist, hip, knee bilaterally.  Knee: Bilateral valgus deformity noted. Large thigh to calf ratio.  Very minimal tender today.  ROM full in flexion and extension and lower leg rotation. instability with valgus force.  painful patellar compression. Patellar glide with moderate crepitus. Patellar and quadriceps tendons unremarkable. Hamstring and quadriceps strength is normal.  Right ankle exam shows the patient does have swelling noted.  Patient likely has a negative Thompson test.  Patient was severely tender to palpation over the Achilles itself.  Does have significant swelling over the Achilles.  Limited musculoskeletal ultrasound was performed and interpreted by Lyndal Pulley  Limited ultrasound shows the patient does have a tear of the Achilles.  Approximately 20% of the tendon is torn.  Does have hypoechoic changes and increasing Doppler flow.  Impression: Partial Achilles tear   Impression and Recommendations:     This case required medical decision making of moderate complexity. The above documentation has been reviewed and is accurate and complete Lyndal Pulley, DO       Note: This dictation was prepared with Dragon dictation along with smaller phrase technology. Any transcriptional errors that result from this process are unintentional.

## 2018-07-21 NOTE — Patient Instructions (Addendum)
Good to see you  Partial achilles rupture Try boot daily for next 2-3 weeks Nitroglycerin Protocol   Apply 1/4 nitroglycerin patch to affected area daily.  Change position of patch within the affected area every 24 hours.  You may experience a headache during the first 1-2 weeks of using the patch, these should subside.  If you experience headaches after beginning nitroglycerin patch treatment, you may take your preferred over the counter pain reliever.  Another side effect of the nitroglycerin patch is skin irritation or rash related to patch adhesive.  Please notify our office if you develop more severe headaches or rash, and stop the patch.  Tendon healing with nitroglycerin patch may require 12 to 24 weeks depending on the extent of injury.  Men should not use if taking Viagra, Cialis, or Levitra.   Do not use if you have migraines or rosacea.  Possibly add a heel lift to the boot as well   See me again in 2-3 weeks

## 2018-07-23 ENCOUNTER — Other Ambulatory Visit: Payer: Self-pay | Admitting: Internal Medicine

## 2018-07-30 ENCOUNTER — Encounter: Payer: Self-pay | Admitting: Family Medicine

## 2018-07-30 MED ORDER — CYCLOBENZAPRINE HCL 5 MG PO TABS: 5.0000 mg | ORAL_TABLET | Freq: Three times a day (TID) | ORAL | 1 refills | Status: DC | PRN

## 2018-08-02 DIAGNOSIS — N200 Calculus of kidney: Secondary | ICD-10-CM | POA: Diagnosis not present

## 2018-08-04 ENCOUNTER — Encounter: Payer: Self-pay | Admitting: Family Medicine

## 2018-08-04 ENCOUNTER — Ambulatory Visit: Payer: Self-pay

## 2018-08-04 ENCOUNTER — Ambulatory Visit (INDEPENDENT_AMBULATORY_CARE_PROVIDER_SITE_OTHER): Payer: Medicare HMO | Admitting: Family Medicine

## 2018-08-04 ENCOUNTER — Other Ambulatory Visit: Payer: Self-pay

## 2018-08-04 VITALS — BP 116/76 | HR 77 | Ht 68.0 in

## 2018-08-04 DIAGNOSIS — G8929 Other chronic pain: Secondary | ICD-10-CM

## 2018-08-04 DIAGNOSIS — M25571 Pain in right ankle and joints of right foot: Secondary | ICD-10-CM | POA: Diagnosis not present

## 2018-08-04 DIAGNOSIS — S86011A Strain of right Achilles tendon, initial encounter: Secondary | ICD-10-CM

## 2018-08-04 DIAGNOSIS — M170 Bilateral primary osteoarthritis of knee: Secondary | ICD-10-CM

## 2018-08-04 NOTE — Progress Notes (Signed)
James [name], [place] [address] Phone: [phone] Subjective:   James [name], am serving as a scribe for Dr. Hulan [name].   CC: Right ankle pain  James [name]   07/21/2018: Partial tear.  Patient needs to remain nonweightbearing he states because he is the primary caregiver for his wife.  Cam walker given today.  Discussed heel lift, topical anti-inflammatories and icing regimen.  Nitroglycerin patch and warned of potential side effects.  Encourage weight loss.  Will take 6 to 12 weeks to heal.  Follow-up again in 3 to 4 weeks  Update 08/04/2018: James [name] is a 68 y.o. male coming in with complaint of right ankle pain. Partial achilles tear. Pain has moved up into musculotendinous junction. Has been wearing boot but it seems to be causing strain on the area of injury. Pain has not improved since last visit. Continues to use nitro patches. Also placed a heel lift in his shoe but not in the boot. Is now using a cane to ambulate since he is unable to wear the boot.    Past Medical History:  Diagnosis Date  . Arthritis   . ED (erectile dysfunction)   . Hyperlipidemia    Past Surgical History:  Procedure Laterality Date  . DENTAL SURGERY     full dental replacement  . KNEE ARTHROSCOPY  2005   right  . VASECTOMY    . VASECTOMY     Social History   Socioeconomic History  . Marital status: Married    Spouse name: Not on file  . Number of children: Not on file  . Years of education: Not on file  . Highest education level: Not on file  Occupational History  . Not on file  Social Needs  . Financial resource strain: Not on file  . Food insecurity    Worry: Not on file    Inability: Not on file  . Transportation needs    Medical: Not on file    Non-medical: Not on file  Tobacco Use  . Smoking status: Light Tobacco Smoker  . Smokeless tobacco: Never Used  . Tobacco comment: 20 cig/ week  Substance and Sexual  Activity  . Alcohol use: Yes    Comment: rare  . Drug use: [name]  . Sexual activity: Not on file  Lifestyle  . Physical activity    Days per week: Not on file    Minutes per session: Not on file  . Stress: Not on file  Relationships  . Social Herbalist on phone: Not on file    Gets together: Not on file    Attends religious service: Not on file    Active member of club or organization: Not on file    Attends meetings of clubs or organizations: Not on file    Relationship status: Not on file  Other Topics Concern  . Not on file  Social History Narrative  . Not on file   Allergies  Allergen Reactions  . Sulfur Rash   Family History  Problem Relation Age of Onset  . Diabetes Father        ?  Marland Kitchen Hypertension Father        ?  . Diabetes Brother   . Hyperlipidemia Mother   . Coronary artery disease Mother   . Heart attack Mother 18     Current Outpatient Medications (Cardiovascular):  .  atorvastatin (LIPITOR) 20 MG tablet, Take 1 tablet (  20 mg total) by mouth daily. Need office visit for more refills. .  nitroGLYCERIN (NITRODUR - DOSED IN MG/24 HR) 0.2 mg/hr patch, 1/4 patch daily     Current Outpatient Medications (Other):  .  cyclobenzaprine (FLEXERIL) 5 MG tablet, Take 1 tablet (5 mg total) by mouth 3 (three) times daily as needed for muscle spasms.    Past medical history, social, surgical and family history all reviewed in electronic medical record.  [name] pertanent information unless stated regarding to the chief complaint.   Review of Systems:  [name] headache, visual changes, nausea, vomiting, diarrhea, constipation, dizziness, abdominal pain, skin rash, fevers, chills, night sweats, weight loss, swollen lymph nodes, body aches, joint swelling, muscle aches, chest pain, shortness of breath, mood changes.   Objective  There were [name] vitals taken for this visit. Systems examined below as of    General: [name] apparent distress alert and oriented x3 mood and  affect normal, dressed appropriately.  HEENT: Pupils equal, extraocular movements intact  Respiratory: Patient's speak in full sentences and does not appear short of breath  Cardiovascular: 1+ lower extremity edema right side compared to the contralateral side, non tender, [name] erythema  Skin: Warm dry intact with [name] signs of infection or rash on extremities or on axial skeleton.  Abdomen: Soft nontender  Neuro: Cranial nerves II through XII are intact, neurovascularly intact in all extremities with 2+ DTRs and 2+ pulses.  Lymph: [name] lymphadenopathy of posterior or anterior cervical chain or axillae bilaterally.  Gait antalgic gait MSK:  tender with full range of motion and good stability and symmetric strength and tone of shoulders, elbows, wrist, , and bilaterally.  Right ankle exam shows [name] swelling.  Negative Thompson.  Still tender to palpation of the Achilles tendon.  Neurovascular intact distally.   Limited musculoskeletal ultrasound was performed by James [name]  Limited musculoskeletal ultrasound was performed by me showing the patient does have a larger Achilles tendon tear than previously.  Does have some interval healing.  [name] increase in neovascularization Impression: Achilles tear with interval healing    Impression and Recommendations:     This case required medical decision making of moderate complexity. The above documentation has been reviewed and is accurate and complete James SaaZachary M Yamna Mackel, DO       Note: This dictation was prepared with Dragon dictation along with smaller phrase technology. Any transcriptional errors that result from this process are unintentional.

## 2018-08-04 NOTE — Assessment & Plan Note (Signed)
Stable.  Continue conservative therapy.

## 2018-08-04 NOTE — Patient Instructions (Signed)
Continue nitro Naproxen 2x a day for 10 days Wear brace daily See me again in 3 weeks

## 2018-08-04 NOTE — Assessment & Plan Note (Signed)
Partial tearing that seems to be worsening at the moment.  We discussed icing regimen and exercises.  Patient given a pneumatic ankle sleeve patient outlining the knee arthritis at this point.  I do think compensation is contributing.  In December of.  Patient will follow-up again in 3 weeks

## 2018-08-05 ENCOUNTER — Encounter: Payer: Self-pay | Admitting: Family Medicine

## 2018-08-25 ENCOUNTER — Ambulatory Visit: Payer: Medicare HMO | Admitting: Family Medicine

## 2018-08-26 ENCOUNTER — Other Ambulatory Visit: Payer: Self-pay | Admitting: Internal Medicine

## 2018-08-30 ENCOUNTER — Encounter: Payer: Self-pay | Admitting: Family Medicine

## 2018-08-30 ENCOUNTER — Ambulatory Visit: Payer: Medicare HMO | Admitting: Family Medicine

## 2018-08-30 ENCOUNTER — Ambulatory Visit: Payer: Self-pay

## 2018-08-30 ENCOUNTER — Other Ambulatory Visit: Payer: Self-pay

## 2018-08-30 VITALS — BP 142/84 | HR 77 | Ht 68.0 in | Wt 240.0 lb

## 2018-08-30 DIAGNOSIS — M25571 Pain in right ankle and joints of right foot: Secondary | ICD-10-CM | POA: Diagnosis not present

## 2018-08-30 DIAGNOSIS — G8929 Other chronic pain: Secondary | ICD-10-CM

## 2018-08-30 DIAGNOSIS — S86011A Strain of right Achilles tendon, initial encounter: Secondary | ICD-10-CM | POA: Diagnosis not present

## 2018-08-30 NOTE — Assessment & Plan Note (Signed)
Recurrent issue.  Encourage patient to go back to the cam walker at this time Discussed icing regimen at home exercises.  Which activities to avoid.  Encouraged him to use a heel lift this time which he did not do in the past.  Patient will continue to ambulate with the aid of a cane as well.  Follow-up with me again 2 to 4 weeks

## 2018-08-30 NOTE — Patient Instructions (Addendum)
Good to see you Boot for 2 weeks then shoe with a heel lift 1/8th-1/16th of an inch See me again in 4 weeks

## 2018-08-30 NOTE — Progress Notes (Signed)
James [name] D.O. [facility] [address] [place], [place] [address] Phone: [phone] Subjective:   I James NighKana Thompson am serving as a Neurosurgeonscribe for Dr. Antoine PrimasZachary [name].  I'm seeing this patient by the request  of:    CC: Right ankle pain  BJY:[id number]:Subjective   08/04/2018 Partial tearing that seems to be worsening at the moment.  We discussed icing regimen and exercises.  Patient given a pneumatic ankle sleeve patient outlining the knee arthritis at this point.  I do think compensation is contributing.  In December of.  Patient will follow-up again in 3 weeks  08/30/2018 James [name] is a 68 y.o. male coming in with complaint of right achilles pain. States he may have reinjured it stepping incorrectly.  Patient states that his seem to be worsening.  Had increasing in swelling.  Patient states that had to go back to the boot for couple days.  Now trying to wear shoe again.  Continues to have discomfort and pain though.  This seems like worsening of previous symptoms.     Past Medical History:  Diagnosis Date  . Arthritis   . ED (erectile dysfunction)   . Hyperlipidemia    Past Surgical History:  Procedure Laterality Date  . DENTAL SURGERY     full dental replacement  . KNEE ARTHROSCOPY  2005   right  . VASECTOMY    . VASECTOMY     Social History   Socioeconomic History  . Marital status: Married    Spouse name: Not on file  . Number of children: Not on file  . Years of education: Not on file  . Highest education level: Not on file  Occupational History  . Not on file  Social Needs  . Financial resource strain: Not on file  . Food insecurity    Worry: Not on file    Inability: Not on file  . Transportation needs    Medical: Not on file    Non-medical: Not on file  Tobacco Use  . Smoking status: Light Tobacco Smoker  . Smokeless tobacco: Never Used  . Tobacco comment: 20 cig/ week  Substance and Sexual Activity  . Alcohol use: Yes    Comment: rare  . Drug  use: No  . Sexual activity: Not on file  Lifestyle  . Physical activity    Days per week: Not on file    Minutes per session: Not on file  . Stress: Not on file  Relationships  . Social Musicianconnections    Talks on phone: Not on file    Gets together: Not on file    Attends religious service: Not on file    Active member of club or organization: Not on file    Attends meetings of clubs or organizations: Not on file    Relationship status: Not on file  Other Topics Concern  . Not on file  Social History Narrative  . Not on file   Allergies  Allergen Reactions  . Sulfur Rash   Family History  Problem Relation Age of Onset  . Diabetes Father        ?  Marland Kitchen. Hypertension Father        ?  . Diabetes Brother   . Hyperlipidemia Mother   . Coronary artery disease Mother   . Heart attack Mother [id number]     Current Outpatient Medications (Cardiovascular):  .  atorvastatin (LIPITOR) 20 MG tablet, Take 1 tablet (20 mg total) by mouth daily. Need office  visit for more refills. .  nitroGLYCERIN (NITRODUR - DOSED IN MG/24 HR) 0.2 mg/hr patch, 1/4 patch daily     Current Outpatient Medications (Other):  .  cyclobenzaprine (FLEXERIL) 5 MG tablet, Take 1 tablet (5 mg total) by mouth 3 (three) times daily as needed for muscle spasms.    Past medical history, social, surgical and family history all reviewed in electronic medical record.  No pertanent information unless stated regarding to the chief complaint.   Review of Systems:  No headache, visual changes, nausea, vomiting, diarrhea, constipation, dizziness, abdominal pain, skin rash, fevers, chills, night sweats, weight loss, swollen lymph nodes, body aches, joint swelling, chest pain, shortness of breath, mood changes.  Positive muscle aches  Objective  Blood pressure (!) 142/84, pulse 77, height 5\' 8"  (1.727 m), weight 240 lb (108.9 kg), SpO2 96 %.   General: No apparent distress alert and oriented x3 mood and affect normal, dressed  appropriately.  HEENT: Pupils equal, extraocular movements intact  Respiratory: Patient's speak in full sentences and does not appear short of breath  Cardiovascular: No lower extremity edema, non tender, no erythema  Skin: Warm dry intact with no signs of infection or rash on extremities or on axial skeleton.  Abdomen: Soft nontender  Neuro: Cranial nerves II through XII are intact, neurovascularly intact in all extremities with 2+ DTRs and 2+ pulses.  Lymph: No lymphadenopathy of posterior or anterior cervical chain or axillae bilaterally.  Gait severely antalgic MSK:  tender with limited range of motion and good stability and symmetric strength and tone of shoulders, elbows, wrist, hip, bilaterally.  Significant arthritic changes of the knees.  Right ankle exam shows the patient does have swelling over the Achilles.  This is approximately 2 cm from the insertion.  Patient does have full range of motion and negative Thompson test on the ankle and otherwise.  Neurovascularly intact distally.  Limited musculoskeletal ultrasound was performed and interpreted by Lyndal Pulley  Limited ultrasound of patient's right ankle shows the patient does have new partial tear more of the medial fibers of the Achilles tendon.  No true retractions are noted. Impression: New onset tear of Achilles no retraction    Impression and Recommendations:     This case required medical decision making of moderate complexity. The above documentation has been reviewed and is accurate and complete Lyndal Pulley, DO       Note: This dictation was prepared with Dragon dictation along with smaller phrase technology. Any transcriptional errors that result from this process are unintentional.

## 2018-09-27 ENCOUNTER — Telehealth: Payer: Self-pay | Admitting: Internal Medicine

## 2018-09-27 NOTE — Telephone Encounter (Signed)
Pt due for follow up. Made follow up for tomorrow at 9:15.

## 2018-09-27 NOTE — Telephone Encounter (Signed)
Copied from [place] [phone]. Topic: General - Other >> Sep 27, 2018 11:32 AM Celene Kras A wrote: Reason for CRM: Pt called stating he has an appt with Dr. Tamala Julian tomorrow and is requesting the blood work needed for PCP be put in so that he can get blood work done at the same time. Pt is requesting a call back. Please advise.

## 2018-09-27 NOTE — Progress Notes (Signed)
[name] James James [name], [place] [address] Phone: [phone] Subjective:   James James [name], am serving as a scribe for Dr. Hulan [name].  I'[name] seeing this patient by the request  of:    CC:   [id number]   08/30/2018 Recurrent issue.  Encourage patient to go back to the cam walker at this time Discussed icing regimen at home exercises.  Which activities to avoid.  Encouraged him to use a heel lift this time which he did not do in the past.  Patient will continue to ambulate with the aid of a cane as well.  Follow-up with me again 2 to 4 weeks  Update 09/28/2018 James James [name] is a 68 y.o. male coming in with complaint of right ankle pain. Patient states that he wears the boot when not at the office. Is wearing Air Heel in office. Feeling improvement. Tenderness occasionally at base of achilles.  Overall doing very well.     Past Medical History:  Diagnosis Date  . Arthritis   . ED (erectile dysfunction)   . Hyperlipidemia    Past Surgical History:  Procedure Laterality Date  . DENTAL SURGERY     full dental replacement  . KNEE ARTHROSCOPY  2005   right  . VASECTOMY    . VASECTOMY     Social History   Socioeconomic History  . Marital status: Married    Spouse name: Not on file  . Number of children: Not on file  . Years of education: Not on file  . Highest education level: Not on file  Occupational History  . Not on file  Social Needs  . Financial resource strain: Not on file  . Food insecurity    Worry: Not on file    Inability: Not on file  . Transportation needs    Medical: Not on file    Non-medical: Not on file  Tobacco Use  . Smoking status: Light Tobacco Smoker  . Smokeless tobacco: Never Used  . Tobacco comment: 20 cig/ week  Substance and Sexual Activity  . Alcohol use: Yes    Comment: rare  . Drug use: James [name]  . Sexual activity: Not on file  Lifestyle  . Physical activity    Days per week: Not on file     Minutes per session: Not on file  . Stress: Not on file  Relationships  . Social Herbalist on phone: Not on file    Gets together: Not on file    Attends religious service: Not on file    Active member of club or organization: Not on file    Attends meetings of clubs or organizations: Not on file    Relationship status: Not on file  Other Topics Concern  . Not on file  Social History Narrative  . Not on file   Allergies  Allergen Reactions  . Sulfur Rash   Family History  Problem Relation Age of Onset  . Diabetes Father        ?  Marland Kitchen Hypertension Father        ?  . Diabetes Brother   . Hyperlipidemia Mother   . Coronary artery disease Mother   . Heart attack Mother 40     Current Outpatient Medications (Cardiovascular):  .  atorvastatin (LIPITOR) 20 MG tablet, Take 1 tablet (20 mg total) by mouth daily. .  nitroGLYCERIN (NITRODUR - DOSED IN MG/24 HR) 0.2 mg/hr patch, 1/4 patch daily .  sildenafil (REVATIO) 20 MG tablet, 2-5 qd prn        Past medical history, social, surgical and family history all reviewed in electronic medical record.  James [name] pertanent information unless stated regarding to the chief complaint.   Review of Systems:  James [name] headache, visual changes, nausea, vomiting, diarrhea, constipation, dizziness, abdominal pain, skin rash, fevers, chills, night sweats, weight loss, swollen lymph nodes, body aches, joint swelling,  chest pain, shortness of breath, mood changes.  Positive muscle aches  Objective  Height 5\' 8"  (1.727 [name]).    General: James [name] apparent distress alert and oriented x3 mood and affect normal, dressed appropriately.  HEENT: Pupils equal, extraocular movements intact  Respiratory: Patient's speak in full sentences and does not appear short of breath  Cardiovascular: James [name] lower extremity edema, non tender, James [name] erythema  Skin: Warm dry intact with James [name] signs of infection or rash on extremities or on axial skeleton.  Abdomen: Soft nontender   Neuro: Cranial nerves II through XII are intact, neurovascularly intact in all extremities with 2+ DTRs and 2+ pulses.  Lymph: James [name] lymphadenopathy of posterior or anterior cervical chain or axillae bilaterally.  Gait severely antalgic walking with the aid of a cane favoring the right knee MSK:  tender with full range of motion and good stability and symmetric strength and tone of shoulders, elbows, wrist, hip, knee bilaterally.  Right ankle exam still shows a patient has some swelling around the Achilles tendon.  Negative Thompson.  Minimal tenderness to palpation 2 cm from the insertion of the Achilles.  Patient's foot has some mild trace effusion noted.  Ankle itself does not have any effusion.  Limited musculoskeletal ultrasound was performed and interpreted by James James [name]   Area of previous tearing noted to have good scar tissue formation and does show to have some regeneration of the tendon at the moment.  Mild increase in Doppler flow still noted.   Impression and Recommendations:      The above documentation has been reviewed and is accurate and complete James James [name] , DO       Note: This dictation was prepared with Dragon dictation along with smaller phrase technology. Any transcriptional errors that result from this process are unintentional.

## 2018-09-27 NOTE — Progress Notes (Signed)
Subjective:    Patient ID: James [name], male    DOB: 11/27/1950, 68 y.o.   MRN: [id number]  HPI The patient is here for follow up.  His wife has been chronically ill for a while with Parkinson's and dementia.  She has developed bedsores and has one that is severe.  She is currently on hospice and was advised that she only has a couple of weeks to live.  He is not exercising regularly.     He is eating keto and has lost over 20 lbs. he is not currently able to exercise because he injured his Achilles.  Hyperlipidemia: He is taking his medication daily. He is compliant with a low fat/cholesterol diet. He denies myalgias.    Diabetes: He is controlling his sugars with lifestyle. He is compliant with a diabetic diet.   He has stopped eating carbohydrates and is doing the keto diet.  This has resulted in significant weight loss.  He checks his feet daily and denies foot lesions.    Knee OA:  He still has knee pain.  Weight loss has helped slightly.  He knows eventually he will need to have a knee replacement and will contact orthopedics when he is ready.  Tobacco abuse:  He is still smoking, but plans on quitting after his he recovers from his wife's death.     Medications and allergies reviewed with patient and updated if appropriate.  Patient Active Problem List   Diagnosis Date Noted  . Partial Achilles tendon tear, right, initial encounter 07/21/2018  . Degenerative arthritis of knee, bilateral 05/24/2018  . Tobacco abuse 12/09/2017  . Acute right-sided thoracic back pain 08/03/2017  . Degenerative joint disease of knee, right 04/27/2017  . Diabetes mellitus without complication (HCC) 09/20/2016  . Anxiety 09/17/2016  . Popliteal bursitis of right knee 01/24/2014  . Primary localized osteoarthrosis, lower leg 10/07/2013  . Right hamstring muscle strain 10/07/2013  . Severe obesity (BMI >= 40) (HCC) 06/23/2013  . Osteoarthrosis involving lower leg 06/12/2009  . GANGLION OF  TENDON SHEATH 06/12/2009  . Hyperlipidemia 09/27/2008  . 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 ERECTILE DYSFUNCTION 02/24/2006    Current Outpatient Medications on File Prior to Visit  Medication Sig Dispense Refill  . nitroGLYCERIN (NITRODUR - DOSED IN MG/24 HR) 0.2 mg/hr patch 1/4 patch daily 30 patch 1   No current facility-administered medications on file prior to visit.     Past Medical History:  Diagnosis Date  . Arthritis   . ED (erectile dysfunction)   . Hyperlipidemia     Past Surgical History:  Procedure Laterality Date  . DENTAL SURGERY     full dental replacement  . KNEE ARTHROSCOPY  2005   right  . VASECTOMY    . VASECTOMY      Social History   Socioeconomic History  . Marital status: Married    Spouse name: Not on file  . Number of children: Not on file  . Years of education: Not on file  . Highest education level: Not on file  Occupational History  . Not on file  Social Needs  . Financial resource strain: Not on file  . Food insecurity    Worry: Not on file    Inability: Not on file  . Transportation needs    Medical: Not on file    Non-medical: Not on file  Tobacco Use  . Smoking status: Light Tobacco Smoker  . Smokeless tobacco: Never Used  . Tobacco comment:  20 cig/ week  Substance and Sexual Activity  . Alcohol use: Yes    Comment: rare  . Drug use: No  . Sexual activity: Not on file  Lifestyle  . Physical activity    Days per week: Not on file    Minutes per session: Not on file  . Stress: Not on file  Relationships  . Social Herbalist on phone: Not on file    Gets together: Not on file    Attends religious service: Not on file    Active member of club or organization: Not on file    Attends meetings of clubs or organizations: Not on file    Relationship status: Not on file  Other Topics Concern  . Not on file  Social History Narrative  . Not on file    Family History  Problem Relation Age of Onset  . Diabetes  Father        ?  Marland Kitchen Hypertension Father        ?  . Diabetes Brother   . Hyperlipidemia Mother   . Coronary artery disease Mother   . Heart attack Mother 70    Review of Systems  Constitutional: Negative for chills and fever.  Respiratory: Negative for cough, shortness of breath and wheezing.   Cardiovascular: Negative for chest pain, palpitations and leg swelling.  Musculoskeletal: Negative for myalgias.  Neurological: Negative for light-headedness and headaches.       Objective:   Vitals:   09/28/18 0909  BP: 124/70  Pulse: 78  Resp: 18  Temp: 98.4 F (36.9 C)  SpO2: 96%   BP Readings from Last 3 Encounters:  09/28/18 124/70  08/30/18 (!) 142/84  08/04/18 116/76   Wt Readings from Last 3 Encounters:  09/28/18 231 lb (104.8 kg)  08/30/18 240 lb (108.9 kg)  07/21/18 258 lb (117 kg)   Body mass index is 35.12 kg/m.   Physical Exam    Constitutional: Appears well-developed and well-nourished. No distress.  HENT:  Head: Normocephalic and atraumatic.  Neck: Neck supple. No tracheal deviation present. No thyromegaly present.  No cervical lymphadenopathy Cardiovascular: Normal rate, regular rhythm and normal heart sounds.   No murmur heard. No carotid bruit .  No edema Pulmonary/Chest: Effort normal and breath sounds normal. No respiratory distress. No has no wheezes. No rales.  Skin: Skin is warm and dry. Not diaphoretic.  Psychiatric: Normal mood and affect. Behavior is normal.      Assessment & Plan:    See Problem List for Assessment and Plan of chronic medical problems.

## 2018-09-27 NOTE — Patient Instructions (Addendum)
  Tests ordered today. Your results will be released to MyChart (or called to you) after review.  If any changes need to be made, you will be notified at that same time.   Medications reviewed and updated.  Changes include :   none  Your prescription(s) have been submitted to your pharmacy. Please take as directed and contact our office if you believe you are having problem(s) with the medication(s).    Please followup in 6 months   

## 2018-09-28 ENCOUNTER — Ambulatory Visit: Payer: Self-pay

## 2018-09-28 ENCOUNTER — Encounter: Payer: Self-pay | Admitting: Internal Medicine

## 2018-09-28 ENCOUNTER — Encounter: Payer: Self-pay | Admitting: Family Medicine

## 2018-09-28 ENCOUNTER — Ambulatory Visit (INDEPENDENT_AMBULATORY_CARE_PROVIDER_SITE_OTHER): Payer: Medicare HMO | Admitting: Family Medicine

## 2018-09-28 ENCOUNTER — Other Ambulatory Visit (INDEPENDENT_AMBULATORY_CARE_PROVIDER_SITE_OTHER): Payer: Medicare HMO

## 2018-09-28 ENCOUNTER — Ambulatory Visit (INDEPENDENT_AMBULATORY_CARE_PROVIDER_SITE_OTHER): Payer: Medicare HMO | Admitting: Internal Medicine

## 2018-09-28 ENCOUNTER — Other Ambulatory Visit: Payer: Self-pay

## 2018-09-28 VITALS — BP 124/70 | HR 78 | Temp 98.4°F | Resp 18 | Ht 68.0 in | Wt 231.0 lb

## 2018-09-28 VITALS — Ht 68.0 in

## 2018-09-28 DIAGNOSIS — M25571 Pain in right ankle and joints of right foot: Secondary | ICD-10-CM | POA: Diagnosis not present

## 2018-09-28 DIAGNOSIS — E782 Mixed hyperlipidemia: Secondary | ICD-10-CM

## 2018-09-28 DIAGNOSIS — Z6835 Body mass index (BMI) 35.0-35.9, adult: Secondary | ICD-10-CM

## 2018-09-28 DIAGNOSIS — S86011A Strain of right Achilles tendon, initial encounter: Secondary | ICD-10-CM

## 2018-09-28 DIAGNOSIS — G8929 Other chronic pain: Secondary | ICD-10-CM | POA: Diagnosis not present

## 2018-09-28 DIAGNOSIS — E119 Type 2 diabetes mellitus without complications: Secondary | ICD-10-CM

## 2018-09-28 DIAGNOSIS — Z720 Tobacco use: Secondary | ICD-10-CM

## 2018-09-28 LAB — COMPREHENSIVE METABOLIC PANEL
ALT: 18 U/L (ref 0–53)
AST: 16 U/L (ref 0–37)
Albumin: 4.5 g/dL (ref 3.5–5.2)
Alkaline Phosphatase: 74 U/L (ref 39–117)
BUN: 18 mg/dL (ref 6–23)
CO2: 22 mEq/L (ref 19–32)
Calcium: 9.5 mg/dL (ref 8.4–10.5)
Chloride: 109 mEq/L (ref 96–112)
Creatinine, Ser: 0.61 mg/dL (ref 0.40–1.50)
GFR: 131.87 mL/min (ref >60.00)
Glucose, Bld: 101 mg/dL — ABNORMAL HIGH (ref 70–99)
Potassium: 4.2 mEq/L (ref 3.5–5.1)
Sodium: 141 mEq/L (ref 135–145)
Total Bilirubin: 0.2 mg/dL (ref 0.2–1.2)
Total Protein: 7.6 g/dL (ref 6.0–8.3)

## 2018-09-28 LAB — MICROALBUMIN / CREATININE URINE RATIO
Creatinine,U: 131.2 mg/dL
Microalb Creat Ratio: 1 mg/g (ref 0.0–30.0)
Microalb, Ur: 1.3 mg/dL (ref 0.0–1.9)

## 2018-09-28 LAB — LIPID PANEL
Cholesterol: 163 mg/dL (ref 0–200)
HDL: 31 mg/dL — ABNORMAL LOW (ref >39.00)
LDL Cholesterol: 101 mg/dL — ABNORMAL HIGH (ref 0–99)
NonHDL: 131.71
Total CHOL/HDL Ratio: 5
Triglycerides: 152 mg/dL — ABNORMAL HIGH (ref 0.0–149.0)
VLDL: 30.4 mg/dL (ref 0.0–40.0)

## 2018-09-28 LAB — HEMOGLOBIN A1C: Hgb A1c MFr Bld: 5.7 % (ref 4.6–6.5)

## 2018-09-28 MED ORDER — SILDENAFIL CITRATE 20 MG PO TABS: ORAL_TABLET | ORAL | 1 refills | Status: DC

## 2018-09-28 MED ORDER — ATORVASTATIN CALCIUM 20 MG PO TABS: 20.0000 mg | ORAL_TABLET | Freq: Every day | ORAL | 1 refills | Status: DC

## 2018-09-28 NOTE — Assessment & Plan Note (Signed)
Check lipid panel, CMP Continue daily statin Regular exercise and healthy diet encouraged  

## 2018-09-28 NOTE — Assessment & Plan Note (Signed)
Lifestyle controlled He has lost weight Not currently able to exercise, but will restart once able Check A1c, urine microalbumin Follow-up in 6 months

## 2018-09-28 NOTE — Assessment & Plan Note (Signed)
Still smoking-he does want to quit, but knows this is not the right time to try His wife is currently on hospice and he knows that once he gets over her death he will be able to quit

## 2018-09-28 NOTE — Assessment & Plan Note (Signed)
With diabetes, hyperlipidemia Doing the keto diet and has lost over 20 pounds He is not currently able to exercise, but once able will restart regular exercise Encouraged him to continue to work on weight loss Follow-up in 6 months

## 2018-09-28 NOTE — Assessment & Plan Note (Signed)
Partial Achilles tear seems to be significantly improved at this time.  Making progress.  Increasing range of motion exercises.  Patient is adamant he would like to golf but encouraged him to do that still in the cam walker.  Patient will start to transition into shoes in the house and consider doing it more with short errands out of the house in 2 weeks.  Patient does have a pneumatic compression sleeve that I think will be beneficial as well.  Follow-up with me again in 8 weeks for the patient's wife is on hospice care may need to reschedule

## 2018-09-29 ENCOUNTER — Encounter: Payer: Self-pay | Admitting: Internal Medicine

## 2019-02-25 DIAGNOSIS — M1712 Unilateral primary osteoarthritis, left knee: Secondary | ICD-10-CM | POA: Diagnosis not present

## 2019-02-25 DIAGNOSIS — M1711 Unilateral primary osteoarthritis, right knee: Secondary | ICD-10-CM | POA: Diagnosis not present

## 2019-02-25 DIAGNOSIS — M170 Bilateral primary osteoarthritis of knee: Secondary | ICD-10-CM | POA: Diagnosis not present

## 2019-02-25 DIAGNOSIS — M25561 Pain in right knee: Secondary | ICD-10-CM | POA: Diagnosis not present

## 2019-03-09 DIAGNOSIS — E7800 Pure hypercholesterolemia, unspecified: Secondary | ICD-10-CM | POA: Diagnosis not present

## 2019-03-09 DIAGNOSIS — Z961 Presence of intraocular lens: Secondary | ICD-10-CM | POA: Diagnosis not present

## 2019-03-09 DIAGNOSIS — Z0100 Encounter for examination of eyes and vision without abnormal findings: Secondary | ICD-10-CM | POA: Diagnosis not present

## 2019-03-09 DIAGNOSIS — H04123 Dry eye syndrome of bilateral lacrimal glands: Secondary | ICD-10-CM | POA: Diagnosis not present

## 2019-03-09 LAB — HM DIABETES EYE EXAM

## 2019-03-10 ENCOUNTER — Encounter: Payer: Self-pay | Admitting: Internal Medicine

## 2019-03-11 ENCOUNTER — Encounter: Payer: Self-pay | Admitting: Internal Medicine

## 2019-03-17 IMAGING — DX DG KNEE STANDING AP BILAT
1 series · 1 of 1 positions shown · non-contrast
Comparison: None.

CLINICAL DATA: 67-year-old male with chronic bilateral knee pain
greater on right. No known injury. Initial encounter.

EXAM:
BILATERAL KNEES STANDING - 1 VIEW

[knee ap]
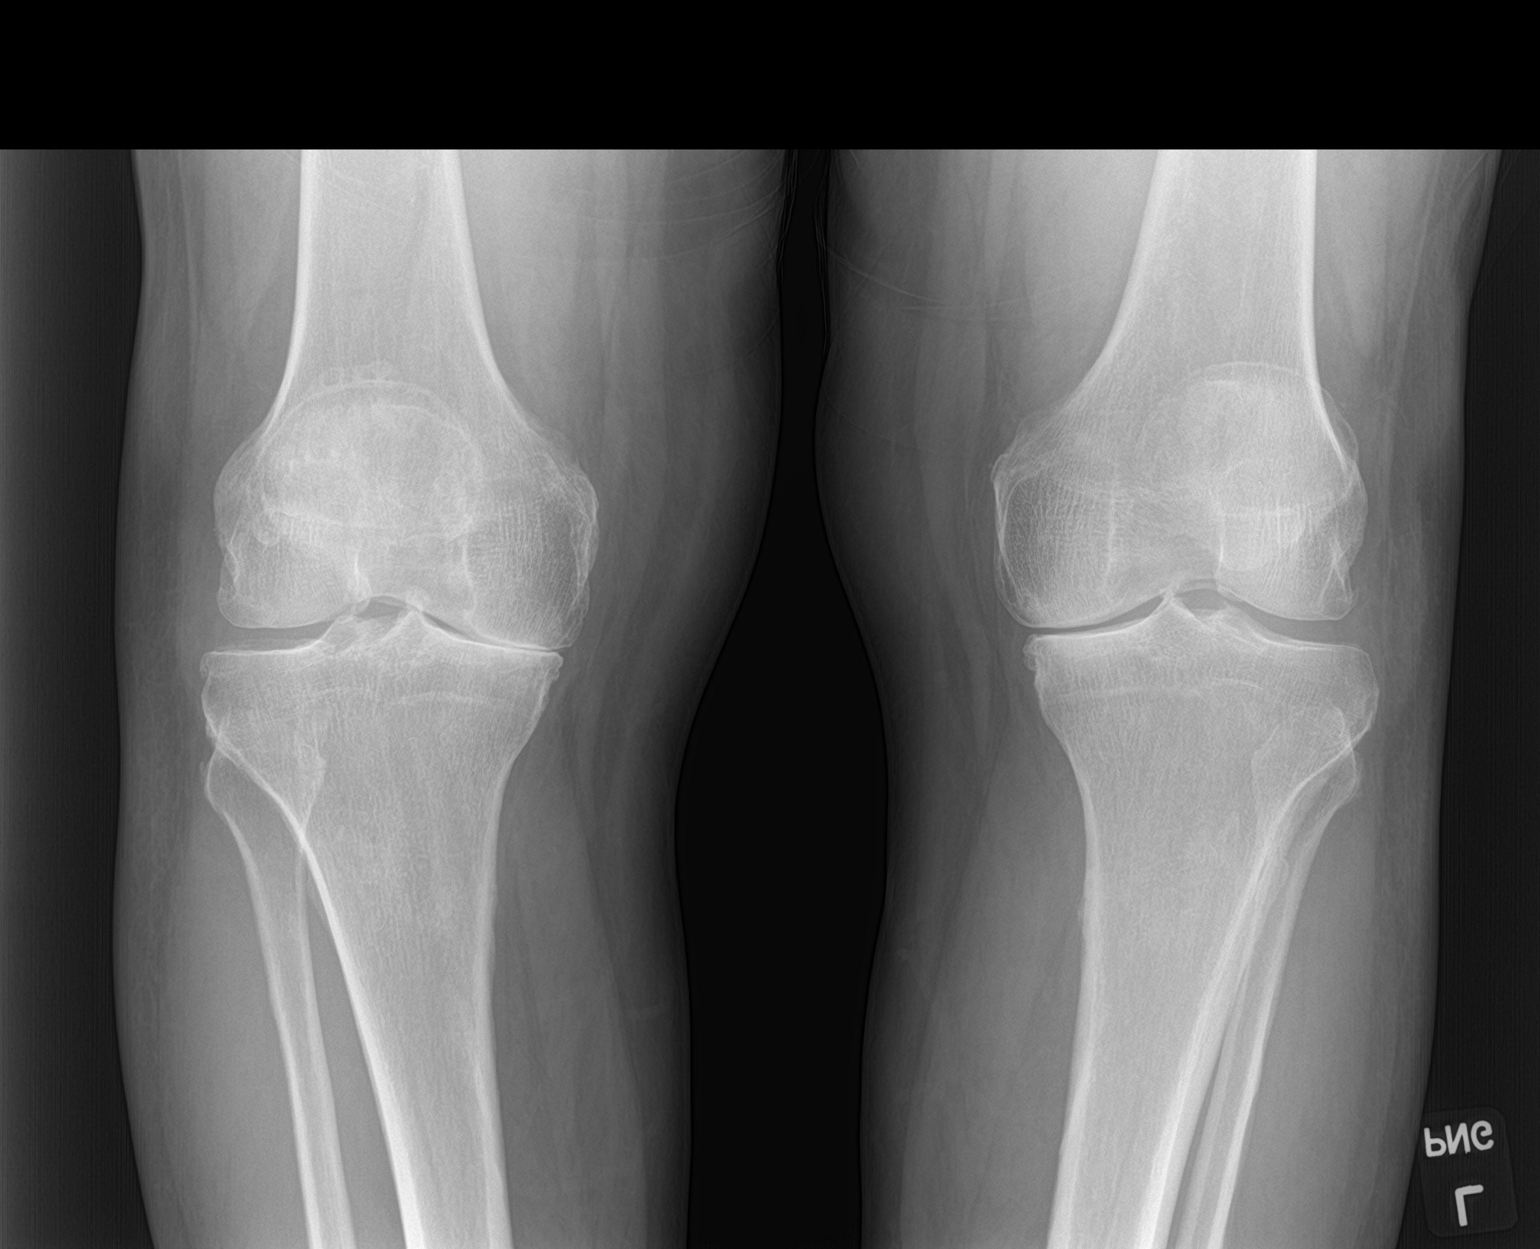

[1 of 1 positions shown; findings below may reference images not displayed]

FINDINGS: Single frontal view of the knees with weight-bearing.

Moderate-to-marked medial tibiofemoral joint space narrowing
bilaterally greater on the right. No significant lateral
tibiofemoral joint space narrowing. Cannot evaluate patellofemoral
joint space without lateral view.
IMPRESSION: Moderate-to-marked medial tibiofemoral joint space narrowing
bilaterally greater on the right.

## 2019-05-05 ENCOUNTER — Encounter: Payer: Self-pay | Admitting: Family Medicine

## 2019-05-05 ENCOUNTER — Ambulatory Visit: Payer: Medicare HMO | Admitting: Family Medicine

## 2019-05-05 ENCOUNTER — Other Ambulatory Visit: Payer: Self-pay

## 2019-05-05 DIAGNOSIS — M170 Bilateral primary osteoarthritis of knee: Secondary | ICD-10-CM | POA: Diagnosis not present

## 2019-05-05 NOTE — Progress Notes (Signed)
[name] [address] [place] [address] Phone: [phone] Subjective:   I James [name] am serving as a Neurosurgeon for Dr. Antoine Primas.  This visit occurred during the SARS-CoV-2 public health emergency.  Safety protocols were in place, including screening questions prior to the visit, additional usage of staff PPE, and extensive cleaning of exam room while observing appropriate contact time as indicated for disinfecting solutions.   I'm seeing this patient by the request  of:  Pincus Sanes, MD  CC: Bilateral knee pain  PFX:[id number]   09/28/2019 Partial Achilles tear seems to be significantly improved at this time.  Making progress.  Increasing range of motion exercises.  Patient is adamant he would like to golf but encouraged him to do that still in the cam walker.  Patient will start to transition into shoes in the house and consider doing it more with short errands out of the house in 2 weeks.  Patient does have a pneumatic compression sleeve that I think will be beneficial as well.  Follow-up with me again in 8 weeks for the patient's wife is on hospice care may need to reschedule  Update 05/05/2019 James [name] is a 69 y.o. male coming in with complaint of bilateral knee pain. Last injection April 2020. Patient states he feels that he is bone on bone. Would like be more active. Moving to [place] soon. Surgery will be in [place].      Past Medical History:  Diagnosis Date  . Arthritis   . ED (erectile dysfunction)   . Hyperlipidemia    Past Surgical History:  Procedure Laterality Date  . DENTAL SURGERY     full dental replacement  . KNEE ARTHROSCOPY  2005   right  . VASECTOMY    . VASECTOMY     Social History   Socioeconomic History  . Marital status: Married    Spouse name: Not on file  . Number of children: Not on file  . Years of education: Not on file  . Highest education level: Not on file  Occupational History  .  Not on file  Tobacco Use  . Smoking status: Light Tobacco Smoker  . Smokeless tobacco: Never Used  . Tobacco comment: 20 cig/ week  Substance and Sexual Activity  . Alcohol use: Yes    Comment: rare  . Drug use: No  . Sexual activity: Not on file  Other Topics Concern  . Not on file  Social History Narrative  . Not on file   Social Determinants of Health   Financial Resource Strain:   . Difficulty of Paying Living Expenses:   Food Insecurity:   . Worried About Programme researcher, broadcasting/film/video in the Last Year:   . Barista in the Last Year:   Transportation Needs:   . Freight forwarder (Medical):   Marland Kitchen Lack of Transportation (Non-Medical):   Physical Activity:   . Days of Exercise per Week:   . Minutes of Exercise per Session:   Stress:   . Feeling of Stress :   Social Connections:   . Frequency of Communication with Friends and Family:   . Frequency of Social Gatherings with Friends and Family:   . Attends Religious Services:   . Active Member of Clubs or Organizations:   . Attends Banker Meetings:   Marland Kitchen Marital Status:    Allergies  Allergen Reactions  . Sulfur Rash   Family History  Problem Relation  Age of Onset  . Diabetes Father        ?  Marland Kitchen Hypertension Father        ?  . Diabetes Brother   . Hyperlipidemia Mother   . Coronary artery disease Mother   . Heart attack Mother 78     Current Outpatient Medications (Cardiovascular):  .  atorvastatin (LIPITOR) 20 MG tablet, Take 1 tablet (20 mg total) by mouth daily. .  nitroGLYCERIN (NITRODUR - DOSED IN MG/24 HR) 0.2 mg/hr patch, 1/4 patch daily .  sildenafil (REVATIO) 20 MG tablet, 2-5 qd prn       Reviewed prior external information including notes and imaging from  primary care provider As well as notes that were available from care everywhere and other healthcare systems.  Past medical history, social, surgical and family history all reviewed in electronic medical record.  No pertanent  information unless stated regarding to the chief complaint.   Review of Systems:  No headache, visual changes, nausea, vomiting, diarrhea, constipation, dizziness, abdominal pain, skin rash, fevers, chills, night sweats, weight loss, swollen lymph nodes, body aches, joint swelling, chest pain, shortness of breath, mood changes. POSITIVE muscle aches  Objective  Blood pressure 138/72, pulse 73, height 5\' 8"  (1.727 m), weight 229 lb (103.9 kg), SpO2 95 %.   General: No apparent distress alert and oriented x3 mood and affect normal, dressed appropriately.  Patient has lost a good amount of weight since we have seen him. HEENT: Pupils equal, extraocular movements intact  Respiratory: Patient's speak in full sentences and does not appear short of breath  Cardiovascular: No lower extremity edema, non tender, no erythema  Neuro: Cranial nerves II through XII are intact, neurovascularly intact in all extremities with 2+ DTRs and 2+ pulses.  Gait significantly antalgic  Knee: Bilateral valgus deformity noted.  Abnormal thigh to calf ratio.  Tender to palpation over medial and PF joint line.  ROM full in flexion and extension and lower leg rotation. instability with valgus force.  painful patellar compression. Patellar glide with moderate crepitus. Patellar and quadriceps tendons unremarkable. Hamstring and quadriceps strength is normal.  After informed written and verbal consent, patient was seated on exam table. Right knee was prepped with alcohol swab and utilizing anterolateral approach, patient's right knee space was injected with 4:1  marcaine 0.5%: Kenalog 40mg /dL. Patient tolerated the procedure well without immediate complications.  After informed written and verbal consent, patient was seated on exam table. Left knee was prepped with alcohol swab and utilizing anterolateral approach, patient's left knee space was injected with 4:1  marcaine 0.5%: Kenalog 40mg /dL. Patient tolerated the  procedure well without immediate complications.    Impression and Recommendations:     This case required medical decision making of moderate complexity. The above documentation has been reviewed and is accurate and complete Lyndal Pulley, DO       Note: This dictation was prepared with Dragon dictation along with smaller phrase technology. Any transcriptional errors that result from this process are unintentional.

## 2019-05-05 NOTE — Assessment & Plan Note (Signed)
Chronic problem : Exacerbation  interventions previously, including medication management: Oral anti-inflammatories, repetitive injections and viscosupplementation   Interventions this visit: Repeat steroid injections secondary to worsening pain.  Encourage more use of custom bracing We discussed with patient the importance ergonomics, home exercises, icing regimen, and over-the-counter natural products.   Future considerations but will be based on evaluation and next visit: Viscosupplementation before patient moves to [place].  Patient wants to hold on any type of surgical intervention secondary to social determinants of health and patient recently moving his wife.    Return to clinic:

## 2019-05-05 NOTE — Patient Instructions (Addendum)
Good to see you See me again 6 weeks for gel injections before you move

## 2019-05-12 ENCOUNTER — Other Ambulatory Visit: Payer: Self-pay | Admitting: Internal Medicine

## 2019-05-20 ENCOUNTER — Encounter: Payer: Self-pay | Admitting: Internal Medicine

## 2019-05-20 DIAGNOSIS — E119 Type 2 diabetes mellitus without complications: Secondary | ICD-10-CM

## 2019-05-20 DIAGNOSIS — E782 Mixed hyperlipidemia: Secondary | ICD-10-CM

## 2019-06-01 ENCOUNTER — Other Ambulatory Visit (INDEPENDENT_AMBULATORY_CARE_PROVIDER_SITE_OTHER): Payer: Medicare HMO

## 2019-06-01 DIAGNOSIS — E782 Mixed hyperlipidemia: Secondary | ICD-10-CM | POA: Diagnosis not present

## 2019-06-01 DIAGNOSIS — E119 Type 2 diabetes mellitus without complications: Secondary | ICD-10-CM | POA: Diagnosis not present

## 2019-06-01 LAB — COMPREHENSIVE METABOLIC PANEL
ALT: 26 U/L (ref 0–53)
AST: 19 U/L (ref 0–37)
Albumin: 4.2 g/dL (ref 3.5–5.2)
Alkaline Phosphatase: 66 U/L (ref 39–117)
BUN: 19 mg/dL (ref 6–23)
CO2: 25 mEq/L (ref 19–32)
Calcium: 8.9 mg/dL (ref 8.4–10.5)
Chloride: 106 mEq/L (ref 96–112)
Creatinine, Ser: 0.63 mg/dL (ref 0.40–1.50)
GFR: 126.79 mL/min (ref >60.00)
Glucose, Bld: 86 mg/dL (ref 70–99)
Potassium: 4 mEq/L (ref 3.5–5.1)
Sodium: 138 mEq/L (ref 135–145)
Total Bilirubin: 0.4 mg/dL (ref 0.2–1.2)
Total Protein: 7 g/dL (ref 6.0–8.3)

## 2019-06-01 LAB — LIPID PANEL
Cholesterol: 150 mg/dL (ref 0–200)
HDL: 42.3 mg/dL (ref >39.00)
LDL Cholesterol: 91 mg/dL (ref 0–99)
NonHDL: 107.38
Total CHOL/HDL Ratio: 4
Triglycerides: 82 mg/dL (ref 0.0–149.0)
VLDL: 16.4 mg/dL (ref 0.0–40.0)

## 2019-06-01 LAB — HEMOGLOBIN A1C: Hgb A1c MFr Bld: 5.8 % (ref 4.6–6.5)

## 2019-06-02 ENCOUNTER — Encounter: Payer: Self-pay | Admitting: Family Medicine

## 2019-06-02 ENCOUNTER — Encounter: Payer: Self-pay | Admitting: Internal Medicine

## 2019-06-03 NOTE — Progress Notes (Signed)
[name] Phone: [phone] Subjective:    I'm seeing this patient by the request  of:  Binnie Rail, MD  CC: Bilateral knee pain  UKG:[id number]   05/05/2019 Chronic problem : Exacerbation  interventions previously, including medication management: Oral anti-inflammatories, repetitive injections and viscosupplementation   Interventions this visit: Repeat steroid injections secondary to worsening pain.  Encourage more use of custom bracing We discussed with patient the importance ergonomics, home exercises, icing regimen, and over-the-counter natural products.   Future considerations but will be based on evaluation and next visit: Viscosupplementation before patient moves to [place].  Patient wants to hold on any type of surgical intervention secondary to social determinants of health and patient recently moving his wife.   Update 06/06/2019 [name] is a 69 y.o. male coming in with complaint of bilateral knee pain. Monovisc approved. Patient states steroid shots did help think his back pain for approximately 2 weeks and now having significant increasing discomfort again. Patient states still walking with a limp.  Has been able to play golf which has made him a little more of a happier individual     Past Medical History:  Diagnosis Date  . Arthritis   . ED (erectile dysfunction)   . Hyperlipidemia    Past Surgical History:  Procedure Laterality Date  . DENTAL SURGERY     full dental replacement  . KNEE ARTHROSCOPY  2005   right  . VASECTOMY    . VASECTOMY     Social History   Socioeconomic History  . Marital status: Married    Spouse name: Not on file  . Number of children: Not on file  . Years of education: Not on file  . Highest education level: Not on file  Occupational History  . Not on file  Tobacco Use  . Smoking status: Light Tobacco Smoker  . Smokeless tobacco: Never  Used  . Tobacco comment: 20 cig/ week  Substance and Sexual Activity  . Alcohol use: Yes    Comment: rare  . Drug use: No  . Sexual activity: Not on file  Other Topics Concern  . Not on file  Social History Narrative  . Not on file   Social Determinants of Health   Financial Resource Strain:   . Difficulty of Paying Living Expenses:   Food Insecurity:   . Worried About Charity fundraiser in the Last Year:   . Arboriculturist in the Last Year:   Transportation Needs:   . Film/video editor (Medical):   [name] Lack of Transportation (Non-Medical):   Physical Activity:   . Days of Exercise per Week:   . Minutes of Exercise per Session:   Stress:   . Feeling of Stress :   Social Connections:   . Frequency of Communication with Friends and Family:   . Frequency of Social Gatherings with Friends and Family:   . Attends Religious Services:   . Active Member of Clubs or Organizations:   . Attends Archivist Meetings:   [name] Marital Status:    Allergies  Allergen Reactions  . Sulfur Rash   Family History  Problem Relation Age of Onset  . Diabetes Father        ?  [name] Hypertension Father        ?  . Diabetes Brother   . Hyperlipidemia Mother   . Coronary artery disease Mother   . Heart  attack Mother 13     Current Outpatient Medications (Cardiovascular):  .  atorvastatin (LIPITOR) 20 MG tablet, Take 1 tablet (20 mg total) by mouth daily. Need office visit for more refills. .  sildenafil (REVATIO) 20 MG tablet, 2-5 qd prn .  nitroGLYCERIN (NITRODUR - DOSED IN MG/24 HR) 0.2 mg/hr patch, 1/4 patch daily       Reviewed prior external information including notes and imaging from  primary care provider As well as notes that were available from care everywhere and other healthcare systems.  Past medical history, social, surgical and family history all reviewed in electronic medical record.  No pertanent information unless stated regarding to the chief complaint.    Review of Systems:  No headache, visual changes, nausea, vomiting, diarrhea, constipation, dizziness, abdominal pain, skin rash, fevers, chills, night sweats, weight loss, swollen lymph nodes, body aches, joint swelling, chest pain, shortness of breath, mood changes. POSITIVE muscle aches  Objective  Blood pressure 124/80, pulse 81, height 5\' 8"  (1.727 m), weight 230 lb (104.3 kg), SpO2 97 %.   General: No apparent distress alert and oriented x3 mood and affect normal, dressed appropriately.  HEENT: Pupils equal, extraocular movements intact  Respiratory: Patient's speak in full sentences and does not appear short of breath  Cardiovascular: No lower extremity edema, non tender, no erythema  Neuro: Cranial nerves II through XII are intact, neurovascularly intact in all extremities with 2+ DTRs and 2+ pulses.  Gait antalgic Knee: Bilateral valgus deformity noted. Large thigh to calf ratio.  Tender to palpation over medial and PF joint line.  ROM full in flexion and extension and lower leg rotation. instability with valgus force.  painful patellar compression. Patellar glide with moderate crepitus. Patellar and quadriceps tendons unremarkable. Hamstring and quadriceps strength is normal.   After informed written and verbal consent, patient was seated on exam table. Right knee was prepped with alcohol swab and utilizing anterolateral approach, patient's right knee space was injected with 22 mg per 4 mL of Monovisc (sodium hyaluronate) in a prefilled syringe was injected easily into the knee through a 22-gauge needle..Patient tolerated the procedure well without immediate complications.  After informed written and verbal consent, patient was seated on exam table. Left knee was prepped with alcohol swab and utilizing anterolateral approach, patient's left knee space was injected with 22 mg per 4 mL of Monovisc (sodium hyaluronate) in a prefilled syringe was injected easily into the knee  through a 22-gauge needle..Patient tolerated the procedure well without immediate complications.    Impression and Recommendations:     This case required medical decision making of moderate complexity. The above documentation has been reviewed and is accurate and complete , DO       Note: This dictation was prepared with Dragon dictation along with smaller phrase technology. Any transcriptional errors that result from this process are unintentional.

## 2019-06-06 ENCOUNTER — Ambulatory Visit: Payer: Medicare HMO | Admitting: Family Medicine

## 2019-06-06 ENCOUNTER — Encounter: Payer: Self-pay | Admitting: Family Medicine

## 2019-06-06 ENCOUNTER — Other Ambulatory Visit: Payer: Self-pay

## 2019-06-06 DIAGNOSIS — M170 Bilateral primary osteoarthritis of knee: Secondary | ICD-10-CM | POA: Diagnosis not present

## 2019-06-06 NOTE — Patient Instructions (Signed)
Good to see you  Enjoy the beach and the sand  See me again if you are in town

## 2019-06-06 NOTE — Assessment & Plan Note (Signed)
Chronic problem with exacerbation.  Has responded well to gel injections that were done about a year ago at this time.  Patient will be moving to [place].  Discussed with him that he can come back if necessary.  Discussed medications at great length and topical anti-inflammatory given.  Discussed over-the-counter medications and when to use secondary to social determinants of health and other comorbidities.  Patient will follow up with me again if in town.

## 2019-06-17 ENCOUNTER — Other Ambulatory Visit: Payer: Self-pay | Admitting: Internal Medicine

## 2019-07-12 ENCOUNTER — Encounter: Payer: Self-pay | Admitting: Internal Medicine

## 2019-08-03 ENCOUNTER — Encounter: Payer: Self-pay | Admitting: Internal Medicine

## 2019-08-03 MED ORDER — SILDENAFIL CITRATE 20 MG PO TABS: ORAL_TABLET | ORAL | 0 refills | Status: DC

## 2019-08-03 MED ORDER — ATORVASTATIN CALCIUM 20 MG PO TABS: 20.0000 mg | ORAL_TABLET | Freq: Every day | ORAL | 0 refills | Status: DC

## 2019-08-11 ENCOUNTER — Other Ambulatory Visit: Payer: Self-pay | Admitting: Internal Medicine

## 2019-08-18 ENCOUNTER — Other Ambulatory Visit: Payer: Self-pay | Admitting: Internal Medicine

## 2019-09-21 ENCOUNTER — Encounter: Payer: Self-pay | Admitting: Family Medicine

## 2019-09-22 ENCOUNTER — Other Ambulatory Visit: Payer: Self-pay

## 2019-09-22 ENCOUNTER — Encounter: Payer: Self-pay | Admitting: Family Medicine

## 2019-09-22 ENCOUNTER — Ambulatory Visit: Payer: Medicare HMO | Admitting: Family Medicine

## 2019-09-22 DIAGNOSIS — M170 Bilateral primary osteoarthritis of knee: Secondary | ICD-10-CM

## 2019-09-22 NOTE — Assessment & Plan Note (Signed)
Bilateral injections given today.  Does have severe arthritic changes.  Wants to hold on any type of surgical intervention of course.  Has tried the viscosupplementation with very minimal benefit.  We discussed we can repeat if necessary but patient will be moving to [place] long-term and we will forward our notes.  Patient will follow up with me as needed otherwise for this chronic problem with exacerbation

## 2019-09-22 NOTE — Progress Notes (Signed)
[name] [address] [place] [address] Phone: [phone] Subjective:   James [name], am serving as a scribe for Dr. Antoine Primas. This visit occurred during the SARS-CoV-2 public health emergency.  Safety protocols were in place, including screening questions prior to the visit, additional usage of staff PPE, and extensive cleaning of exam room while observing appropriate contact time as indicated for disinfecting solutions.   I'm seeing this patient by the request  of:  Pincus Sanes, MD  CC: Bilateral knee pain  [id number]   06/06/2019 Chronic problem with exacerbation.  Has responded well to gel injections that were done about a year ago at this time.  Patient will be moving to [place].  Discussed with him that he can come back if necessary.  Discussed medications at great length and topical anti-inflammatory given.  Discussed over-the-counter medications and when to use secondary to social determinants of health and other comorbidities.  Patient will follow up with me again if in town.  Update 09/22/2019 James [name] is a 69 y.o. male coming in with complaint of bilateral knee pain. Patient states started having increasing discomfort and pain and instability again.  Has walked 18 holes of golf for the last 4 days.  When he was playing golf in [place] did not have this problem because he was using the golf cart but is unable to hear.  Pain, swelling, decreasing range of motion.     Past Medical History:  Diagnosis Date  . Arthritis   . ED (erectile dysfunction)   . Hyperlipidemia    Past Surgical History:  Procedure Laterality Date  . DENTAL SURGERY     full dental replacement  . KNEE ARTHROSCOPY  2005   right  . VASECTOMY    . VASECTOMY     Social History   Socioeconomic History  . Marital status: Married    Spouse name: Not on file  . Number of children: Not on file  . Years of education: Not on file  . Highest  education level: Not on file  Occupational History  . Not on file  Tobacco Use  . Smoking status: Light Tobacco Smoker  . Smokeless tobacco: Never Used  . Tobacco comment: 20 cig/ week  Substance and Sexual Activity  . Alcohol use: Yes    Comment: rare  . Drug use: No  . Sexual activity: Not on file  Other Topics Concern  . Not on file  Social History Narrative  . Not on file   Social Determinants of Health   Financial Resource Strain:   . Difficulty of Paying Living Expenses:   Food Insecurity:   . Worried About Programme researcher, broadcasting/film/video in the Last Year:   . Barista in the Last Year:   Transportation Needs:   . Freight forwarder (Medical):   Marland Kitchen Lack of Transportation (Non-Medical):   Physical Activity:   . Days of Exercise per Week:   . Minutes of Exercise per Session:   Stress:   . Feeling of Stress :   Social Connections:   . Frequency of Communication with Friends and Family:   . Frequency of Social Gatherings with Friends and Family:   . Attends Religious Services:   . Active Member of Clubs or Organizations:   . Attends Banker Meetings:   Marland Kitchen Marital Status:    Allergies  Allergen Reactions  . Sulfur Rash   Family History  Problem Relation  Age of Onset  . Diabetes Father        ?  Marland Kitchen Hypertension Father        ?  . Diabetes Brother   . Hyperlipidemia Mother   . Coronary artery disease Mother   . Heart attack Mother 2     Current Outpatient Medications (Cardiovascular):  .  atorvastatin (LIPITOR) 20 MG tablet, Take 1 tablet (20 mg total) by mouth daily. Annual appt due in [place] must see provider for future refills .  sildenafil (REVATIO) 20 MG tablet, TAKE 2 TO 5 TABLETS BY MOUTH EVERY DAY AS NEEDED .  nitroGLYCERIN (NITRODUR - DOSED IN MG/24 HR) 0.2 mg/hr patch, 1/4 patch daily       Reviewed prior external information including notes and imaging from  primary care provider As well as notes that were available from care  everywhere and other healthcare systems.  Past medical history, social, surgical and family history all reviewed in electronic medical record.  No pertanent information unless stated regarding to the chief complaint.   Review of Systems:  No headache, visual changes, nausea, vomiting, diarrhea, constipation, dizziness, abdominal pain, skin rash, fevers, chills, night sweats, weight loss, swollen lymph nodes, body aches, joint swelling, chest pain, shortness of breath, mood changes. POSITIVE muscle aches  Objective  Blood pressure 118/84, pulse 64, height 5\' 8"  (1.727 m), weight 230 lb (104.3 kg), SpO2 96 %.   General: No apparent distress alert and oriented x3 mood and affect normal, dressed appropriately.  HEENT: Pupils equal, extraocular movements intact  Respiratory: Patient's speak in full sentences and does not appear short of breath  Cardiovascular: No lower extremity edema, non tender, no erythema  Neuro: Cranial nerves II through XII are intact, neurovascularly intact in all extremities with 2+ DTRs and 2+ pulses.  Gait antalgic MSK: Arthritic changes of multiple joints Knee: Bilateral valgus deformity noted. Large thigh to calf ratio.  Tender to palpation over medial and PF joint line.  ROM full in flexion and extension and lower leg rotation. instability with valgus force.  painful patellar compression. Patellar glide with moderate crepitus. Patellar and quadriceps tendons unremarkable. Hamstring and quadriceps strength is normal.  After informed written and verbal consent, patient was seated on exam table. Right knee was prepped with alcohol swab and utilizing anterolateral approach, patient's right knee space was injected with 4:1  marcaine 0.5%: Kenalog 40mg /dL. Patient tolerated the procedure well without immediate complications.  After informed written and verbal consent, patient was seated on exam table. Left knee was prepped with alcohol swab and utilizing anterolateral  approach, patient's left knee space was injected with 4:1  marcaine 0.5%: Kenalog 40mg /dL. Patient tolerated the procedure well without immediate complications.   Impression and Recommendations:     The above documentation has been reviewed and is accurate and complete , DO       Note: This dictation was prepared with Dragon dictation along with smaller phrase technology. Any transcriptional errors that result from this process are unintentional.

## 2019-09-29 DIAGNOSIS — E669 Obesity, unspecified: Secondary | ICD-10-CM | POA: Diagnosis not present

## 2019-09-29 DIAGNOSIS — Z0289 Encounter for other administrative examinations: Secondary | ICD-10-CM | POA: Diagnosis not present

## 2019-09-29 DIAGNOSIS — M179 Osteoarthritis of knee, unspecified: Secondary | ICD-10-CM | POA: Diagnosis not present

## 2019-09-29 DIAGNOSIS — E782 Mixed hyperlipidemia: Secondary | ICD-10-CM | POA: Diagnosis not present

## 2019-09-29 DIAGNOSIS — Z125 Encounter for screening for malignant neoplasm of prostate: Secondary | ICD-10-CM | POA: Diagnosis not present

## 2019-09-29 DIAGNOSIS — Z6834 Body mass index (BMI) 34.0-34.9, adult: Secondary | ICD-10-CM | POA: Diagnosis not present

## 2019-10-29 ENCOUNTER — Other Ambulatory Visit: Payer: Self-pay | Admitting: Internal Medicine

## 2019-10-31 ENCOUNTER — Other Ambulatory Visit: Payer: Self-pay | Admitting: Internal Medicine

## 2020-09-14 ENCOUNTER — Encounter: Payer: Self-pay | Admitting: Family Medicine
